# Patient Record
Sex: Female | Born: 1935 | Race: White | Hispanic: No | Smoking: Never smoker
Health system: Southern US, Community
[De-identification: ages and names within clinical notes are randomized; demographics above are authoritative.]

## PROBLEM LIST (undated history)

## (undated) DIAGNOSIS — I2510 Atherosclerotic heart disease of native coronary artery without angina pectoris: Secondary | ICD-10-CM

## (undated) DIAGNOSIS — E785 Hyperlipidemia, unspecified: Secondary | ICD-10-CM

## (undated) DIAGNOSIS — K5090 Crohn's disease, unspecified, without complications: Secondary | ICD-10-CM

## (undated) DIAGNOSIS — K529 Noninfective gastroenteritis and colitis, unspecified: Secondary | ICD-10-CM

## (undated) DIAGNOSIS — M069 Rheumatoid arthritis, unspecified: Secondary | ICD-10-CM

## (undated) DIAGNOSIS — K222 Esophageal obstruction: Secondary | ICD-10-CM

## (undated) DIAGNOSIS — D649 Anemia, unspecified: Secondary | ICD-10-CM

## (undated) DIAGNOSIS — C50911 Malignant neoplasm of unspecified site of right female breast: Secondary | ICD-10-CM

## (undated) DIAGNOSIS — I10 Essential (primary) hypertension: Secondary | ICD-10-CM

## (undated) DIAGNOSIS — K219 Gastro-esophageal reflux disease without esophagitis: Secondary | ICD-10-CM

## (undated) DIAGNOSIS — E119 Type 2 diabetes mellitus without complications: Secondary | ICD-10-CM

## (undated) DIAGNOSIS — M1990 Unspecified osteoarthritis, unspecified site: Secondary | ICD-10-CM

## (undated) HISTORY — DX: Noninfective gastroenteritis and colitis, unspecified: K52.9

## (undated) HISTORY — DX: Atherosclerotic heart disease of native coronary artery without angina pectoris: I25.10

## (undated) HISTORY — PX: VASCULAR SURGERY: SHX849

## (undated) HISTORY — DX: Malignant neoplasm of unspecified site of right female breast: C50.911

## (undated) HISTORY — DX: Crohn's disease, unspecified, without complications: K50.90

## (undated) HISTORY — PX: APPENDECTOMY: SHX54

## (undated) HISTORY — PX: SIMPLE MASTECTOMY: SHX2409

## (undated) HISTORY — DX: Hyperlipidemia, unspecified: E78.5

## (undated) HISTORY — PX: CAROTID ENDARTERECTOMY: SUR193

## (undated) HISTORY — DX: Unspecified osteoarthritis, unspecified site: M19.90

## (undated) HISTORY — DX: Type 2 diabetes mellitus without complications: E11.9

## (undated) HISTORY — PX: ABDOMINAL HYSTERECTOMY: SHX81

## (undated) HISTORY — DX: Essential (primary) hypertension: I10

## (undated) HISTORY — DX: Gastro-esophageal reflux disease without esophagitis: K21.9

## (undated) HISTORY — PX: TONSILLECTOMY AND ADENOIDECTOMY: SUR1326

## (undated) HISTORY — DX: Rheumatoid arthritis, unspecified: M06.9

## (undated) HISTORY — PX: BREAST EXCISIONAL BIOPSY: SUR124

## (undated) HISTORY — DX: Anemia, unspecified: D64.9

## (undated) HISTORY — DX: Esophageal obstruction: K22.2

## (undated) HISTORY — PX: TOTAL KNEE ARTHROPLASTY: SHX125

---

## 1999-10-10 ENCOUNTER — Encounter: Admission: RE | Admit: 1999-10-10 | Discharge: 1999-10-10 | Payer: Self-pay | Admitting: General Surgery

## 1999-10-10 ENCOUNTER — Encounter: Payer: Self-pay | Admitting: General Surgery

## 2000-10-12 ENCOUNTER — Encounter: Admission: RE | Admit: 2000-10-12 | Discharge: 2000-10-12 | Payer: Self-pay | Admitting: General Surgery

## 2000-10-12 ENCOUNTER — Encounter: Payer: Self-pay | Admitting: General Surgery

## 2001-10-25 ENCOUNTER — Encounter: Payer: Self-pay | Admitting: General Surgery

## 2001-10-25 ENCOUNTER — Encounter: Admission: RE | Admit: 2001-10-25 | Discharge: 2001-10-25 | Payer: Self-pay | Admitting: General Surgery

## 2002-11-02 ENCOUNTER — Encounter: Admission: RE | Admit: 2002-11-02 | Discharge: 2002-11-02 | Payer: Self-pay | Admitting: General Surgery

## 2002-11-02 ENCOUNTER — Encounter: Payer: Self-pay | Admitting: General Surgery

## 2003-11-08 ENCOUNTER — Encounter: Admission: RE | Admit: 2003-11-08 | Discharge: 2003-11-08 | Payer: Self-pay | Admitting: General Surgery

## 2004-09-05 ENCOUNTER — Ambulatory Visit: Payer: Self-pay

## 2004-10-06 ENCOUNTER — Ambulatory Visit: Payer: Self-pay

## 2004-11-12 ENCOUNTER — Encounter: Admission: RE | Admit: 2004-11-12 | Discharge: 2004-11-12 | Payer: Self-pay | Admitting: General Surgery

## 2005-01-23 ENCOUNTER — Ambulatory Visit: Payer: Self-pay

## 2005-11-02 ENCOUNTER — Ambulatory Visit: Payer: Self-pay | Admitting: Internal Medicine

## 2005-11-19 ENCOUNTER — Encounter: Admission: RE | Admit: 2005-11-19 | Discharge: 2005-11-19 | Payer: Self-pay | Admitting: General Surgery

## 2005-11-20 ENCOUNTER — Ambulatory Visit: Payer: Self-pay

## 2006-07-28 ENCOUNTER — Ambulatory Visit: Payer: Self-pay | Admitting: Internal Medicine

## 2006-07-29 ENCOUNTER — Ambulatory Visit: Payer: Self-pay | Admitting: Internal Medicine

## 2006-10-21 ENCOUNTER — Ambulatory Visit: Payer: Self-pay

## 2006-12-15 ENCOUNTER — Encounter: Admission: RE | Admit: 2006-12-15 | Discharge: 2006-12-15 | Payer: Self-pay | Admitting: General Surgery

## 2007-04-13 ENCOUNTER — Ambulatory Visit: Payer: Self-pay

## 2007-05-02 ENCOUNTER — Ambulatory Visit: Payer: Self-pay

## 2007-10-11 ENCOUNTER — Ambulatory Visit: Payer: Self-pay

## 2007-12-28 ENCOUNTER — Encounter: Payer: Self-pay | Admitting: Specialist

## 2008-01-27 ENCOUNTER — Encounter: Payer: Self-pay | Admitting: Specialist

## 2008-02-14 ENCOUNTER — Emergency Department (HOSPITAL_COMMUNITY): Admission: EM | Admit: 2008-02-14 | Discharge: 2008-02-14 | Payer: Self-pay | Admitting: Emergency Medicine

## 2008-02-27 ENCOUNTER — Encounter: Payer: Self-pay | Admitting: Specialist

## 2008-02-28 ENCOUNTER — Ambulatory Visit: Payer: Self-pay

## 2008-02-29 ENCOUNTER — Ambulatory Visit: Payer: Self-pay

## 2008-03-28 ENCOUNTER — Encounter: Payer: Self-pay | Admitting: Specialist

## 2008-04-28 ENCOUNTER — Encounter: Payer: Self-pay | Admitting: Specialist

## 2008-05-29 ENCOUNTER — Encounter: Payer: Self-pay | Admitting: Specialist

## 2008-09-28 ENCOUNTER — Ambulatory Visit: Payer: Self-pay | Admitting: Oncology

## 2008-10-05 ENCOUNTER — Ambulatory Visit: Payer: Self-pay | Admitting: Unknown Physician Specialty

## 2008-10-09 ENCOUNTER — Ambulatory Visit: Payer: Self-pay | Admitting: Oncology

## 2008-10-29 ENCOUNTER — Ambulatory Visit: Payer: Self-pay | Admitting: Oncology

## 2008-11-02 ENCOUNTER — Ambulatory Visit: Payer: Self-pay | Admitting: Unknown Physician Specialty

## 2008-11-26 ENCOUNTER — Ambulatory Visit: Payer: Self-pay | Admitting: Oncology

## 2008-11-30 ENCOUNTER — Ambulatory Visit: Payer: Self-pay | Admitting: Oncology

## 2008-12-27 ENCOUNTER — Ambulatory Visit: Payer: Self-pay | Admitting: Oncology

## 2009-02-26 ENCOUNTER — Ambulatory Visit: Payer: Self-pay | Admitting: Oncology

## 2009-03-15 ENCOUNTER — Ambulatory Visit: Payer: Self-pay | Admitting: Oncology

## 2009-03-28 ENCOUNTER — Ambulatory Visit: Payer: Self-pay | Admitting: Oncology

## 2009-05-28 ENCOUNTER — Ambulatory Visit: Payer: Self-pay

## 2009-06-12 ENCOUNTER — Ambulatory Visit: Payer: Self-pay

## 2009-06-28 ENCOUNTER — Ambulatory Visit: Payer: Self-pay | Admitting: Oncology

## 2009-07-05 ENCOUNTER — Ambulatory Visit: Payer: Self-pay | Admitting: Oncology

## 2009-07-29 ENCOUNTER — Ambulatory Visit: Payer: Self-pay | Admitting: Oncology

## 2010-03-25 ENCOUNTER — Ambulatory Visit: Payer: Self-pay | Admitting: Family Medicine

## 2010-05-20 ENCOUNTER — Emergency Department: Payer: Self-pay | Admitting: Internal Medicine

## 2010-06-18 ENCOUNTER — Ambulatory Visit: Payer: Self-pay

## 2010-08-29 ENCOUNTER — Ambulatory Visit: Payer: Self-pay | Admitting: Oncology

## 2010-09-28 ENCOUNTER — Ambulatory Visit: Payer: Self-pay | Admitting: Oncology

## 2010-09-28 HISTORY — PX: BREAST BIOPSY: SHX20

## 2010-10-29 ENCOUNTER — Ambulatory Visit: Payer: Self-pay | Admitting: Oncology

## 2011-07-27 ENCOUNTER — Ambulatory Visit: Payer: Self-pay

## 2011-07-29 ENCOUNTER — Ambulatory Visit: Payer: Self-pay

## 2011-09-07 ENCOUNTER — Ambulatory Visit: Payer: Self-pay | Admitting: Surgery

## 2011-09-29 HISTORY — PX: MASTECTOMY PARTIAL / LUMPECTOMY W/ AXILLARY LYMPHADENECTOMY: SUR852

## 2012-03-10 ENCOUNTER — Ambulatory Visit: Payer: Self-pay | Admitting: General Practice

## 2012-04-20 ENCOUNTER — Ambulatory Visit: Payer: Self-pay | Admitting: Oncology

## 2012-04-20 LAB — IRON AND TIBC
Iron Bind.Cap.(Total): 340 ug/dL (ref 250–450)
Unbound Iron-Bind.Cap.: 227 ug/dL

## 2012-04-20 LAB — FERRITIN: Ferritin (ARMC): 53 ng/mL (ref 8–388)

## 2012-04-28 ENCOUNTER — Ambulatory Visit: Payer: Self-pay | Admitting: Oncology

## 2012-05-25 LAB — CBC CANCER CENTER
Basophil %: 1.4 %
Eosinophil #: 0.3 x10 3/mm (ref 0.0–0.7)
HCT: 31.1 % — ABNORMAL LOW (ref 35.0–47.0)
Lymphocyte #: 0.8 x10 3/mm — ABNORMAL LOW (ref 1.0–3.6)
MCH: 29.1 pg (ref 26.0–34.0)
MCV: 90 fL (ref 80–100)
Monocyte #: 0.2 x10 3/mm (ref 0.2–0.9)
Monocyte %: 4.8 %
Neutrophil #: 2.2 x10 3/mm (ref 1.4–6.5)
Platelet: 172 x10 3/mm (ref 150–440)
RBC: 3.45 10*6/uL — ABNORMAL LOW (ref 3.80–5.20)
RDW: 29.4 % — ABNORMAL HIGH (ref 11.5–14.5)

## 2012-05-25 LAB — SEDIMENTATION RATE: Erythrocyte Sed Rate: 32 mm/hr — ABNORMAL HIGH (ref 0–30)

## 2012-05-25 LAB — FERRITIN: Ferritin (ARMC): 234 ng/mL (ref 8–388)

## 2012-05-29 ENCOUNTER — Ambulatory Visit: Payer: Self-pay | Admitting: Oncology

## 2012-09-07 ENCOUNTER — Ambulatory Visit: Payer: Self-pay | Admitting: Oncology

## 2012-09-28 ENCOUNTER — Ambulatory Visit: Payer: Self-pay | Admitting: Oncology

## 2012-10-07 ENCOUNTER — Emergency Department: Payer: Self-pay | Admitting: Internal Medicine

## 2012-10-07 LAB — CBC
HGB: 12.7 g/dL (ref 12.0–16.0)
MCH: 28.7 pg (ref 26.0–34.0)
MCHC: 32.1 g/dL (ref 32.0–36.0)
Platelet: 271 10*3/uL (ref 150–440)
RDW: 15.7 % — ABNORMAL HIGH (ref 11.5–14.5)

## 2012-10-07 LAB — COMPREHENSIVE METABOLIC PANEL
Albumin: 3.6 g/dL (ref 3.4–5.0)
Alkaline Phosphatase: 96 U/L (ref 50–136)
Anion Gap: 5 — ABNORMAL LOW (ref 7–16)
BUN: 12 mg/dL (ref 7–18)
Bilirubin,Total: 0.4 mg/dL (ref 0.2–1.0)
Calcium, Total: 9.6 mg/dL (ref 8.5–10.1)
Co2: 29 mmol/L (ref 21–32)
EGFR (African American): >60
EGFR (Non-African Amer.): 57 — ABNORMAL LOW
Glucose: 110 mg/dL — ABNORMAL HIGH (ref 65–99)
Potassium: 3.7 mmol/L (ref 3.5–5.1)
SGOT(AST): 24 U/L (ref 15–37)
SGPT (ALT): 22 U/L (ref 12–78)
Sodium: 141 mmol/L (ref 136–145)
Total Protein: 7.6 g/dL (ref 6.4–8.2)

## 2012-10-07 LAB — SEDIMENTATION RATE: Erythrocyte Sed Rate: 44 mm/hr — ABNORMAL HIGH (ref 0–30)

## 2012-11-01 ENCOUNTER — Ambulatory Visit: Payer: Self-pay | Admitting: Oncology

## 2012-12-07 ENCOUNTER — Ambulatory Visit: Payer: Self-pay | Admitting: Oncology

## 2012-12-07 LAB — CBC CANCER CENTER
Basophil #: 0.1 x10 3/mm (ref 0.0–0.1)
HCT: 33.3 % — ABNORMAL LOW (ref 35.0–47.0)
MCV: 90 fL (ref 80–100)
Monocyte #: 0.8 x10 3/mm (ref 0.2–0.9)
Neutrophil %: 71.7 %
RDW: 16.5 % — ABNORMAL HIGH (ref 11.5–14.5)
WBC: 8.1 x10 3/mm (ref 3.6–11.0)

## 2012-12-27 ENCOUNTER — Ambulatory Visit: Payer: Self-pay | Admitting: Oncology

## 2013-04-12 ENCOUNTER — Ambulatory Visit: Payer: Self-pay | Admitting: Oncology

## 2013-04-12 LAB — CBC CANCER CENTER
Basophil %: 1.1 %
Eosinophil #: 0.3 x10 3/mm (ref 0.0–0.7)
Lymphocyte #: 1.8 x10 3/mm (ref 1.0–3.6)
MCHC: 31.8 g/dL — ABNORMAL LOW (ref 32.0–36.0)
MCV: 80 fL (ref 80–100)
Monocyte #: 1 x10 3/mm — ABNORMAL HIGH (ref 0.2–0.9)
Neutrophil %: 51.5 %
Platelet: 206 x10 3/mm (ref 150–440)
RDW: 16 % — ABNORMAL HIGH (ref 11.5–14.5)

## 2013-04-13 LAB — CANCER ANTIGEN 27.29: CA 27.29: 36.3 U/mL (ref 0.0–38.6)

## 2013-04-28 ENCOUNTER — Ambulatory Visit: Payer: Self-pay | Admitting: Oncology

## 2013-08-14 ENCOUNTER — Ambulatory Visit: Payer: Self-pay | Admitting: Oncology

## 2013-08-14 LAB — IRON AND TIBC
Iron Bind.Cap.(Total): 361 ug/dL (ref 250–450)
Iron: 56 ug/dL (ref 50–170)

## 2013-08-14 LAB — CBC CANCER CENTER
Basophil #: 0.1 x10 3/mm (ref 0.0–0.1)
Eosinophil #: 0.2 x10 3/mm (ref 0.0–0.7)
Eosinophil %: 3.6 %
Lymphocyte #: 1.5 x10 3/mm (ref 1.0–3.6)
MCV: 91 fL (ref 80–100)
Monocyte %: 11.9 %
Neutrophil #: 3.9 x10 3/mm (ref 1.4–6.5)
Neutrophil %: 60.1 %
Platelet: 209 x10 3/mm (ref 150–440)
WBC: 6.5 x10 3/mm (ref 3.6–11.0)

## 2013-08-15 LAB — CANCER ANTIGEN 27.29: CA 27.29: 41.2 U/mL — ABNORMAL HIGH (ref 0.0–38.6)

## 2013-08-28 ENCOUNTER — Ambulatory Visit: Payer: Self-pay | Admitting: Oncology

## 2013-10-19 ENCOUNTER — Ambulatory Visit: Payer: Self-pay | Admitting: Oncology

## 2013-10-20 LAB — CBC CANCER CENTER
Basophil #: 0.1 x10 3/mm (ref 0.0–0.1)
Basophil %: 1 %
Eosinophil #: 0.2 x10 3/mm (ref 0.0–0.7)
Eosinophil %: 3.5 %
HCT: 30.2 % — ABNORMAL LOW (ref 35.0–47.0)
HGB: 9.3 g/dL — AB (ref 12.0–16.0)
Lymphocyte #: 1.6 x10 3/mm (ref 1.0–3.6)
Lymphocyte %: 24.4 %
MCH: 25.6 pg — AB (ref 26.0–34.0)
MCHC: 30.7 g/dL — AB (ref 32.0–36.0)
MCV: 83 fL (ref 80–100)
MONO ABS: 0.9 x10 3/mm (ref 0.2–0.9)
Monocyte %: 13.6 %
Neutrophil #: 3.9 x10 3/mm (ref 1.4–6.5)
Neutrophil %: 57.5 %
PLATELETS: 217 x10 3/mm (ref 150–440)
RBC: 3.63 10*6/uL — AB (ref 3.80–5.20)
RDW: 16.6 % — ABNORMAL HIGH (ref 11.5–14.5)
WBC: 6.7 x10 3/mm (ref 3.6–11.0)

## 2013-10-20 LAB — IRON AND TIBC
IRON SATURATION: 8 %
IRON: 32 ug/dL — AB (ref 50–170)
Iron Bind.Cap.(Total): 384 ug/dL (ref 250–450)
Unbound Iron-Bind.Cap.: 352 ug/dL

## 2013-10-20 LAB — FERRITIN: Ferritin (ARMC): 13 ng/mL (ref 8–388)

## 2013-10-21 LAB — CANCER ANTIGEN 27.29: CA 27.29: 25.2 U/mL (ref 0.0–38.6)

## 2013-10-29 ENCOUNTER — Ambulatory Visit: Payer: Self-pay | Admitting: Oncology

## 2013-11-22 ENCOUNTER — Ambulatory Visit: Payer: Self-pay | Admitting: Oncology

## 2014-01-18 ENCOUNTER — Ambulatory Visit: Payer: Self-pay | Admitting: Oncology

## 2014-01-26 ENCOUNTER — Ambulatory Visit: Payer: Self-pay | Admitting: Oncology

## 2014-01-29 LAB — IRON AND TIBC
IRON BIND. CAP.(TOTAL): 288 ug/dL (ref 250–450)
IRON SATURATION: 16 %
IRON: 46 ug/dL — AB (ref 50–170)
Unbound Iron-Bind.Cap.: 242 ug/dL

## 2014-01-29 LAB — BASIC METABOLIC PANEL
Anion Gap: 9 (ref 7–16)
BUN: 19 mg/dL — ABNORMAL HIGH (ref 7–18)
CHLORIDE: 102 mmol/L (ref 98–107)
Calcium, Total: 8.9 mg/dL (ref 8.5–10.1)
Co2: 29 mmol/L (ref 21–32)
Creatinine: 1.47 mg/dL — ABNORMAL HIGH (ref 0.60–1.30)
EGFR (Non-African Amer.): 34 — ABNORMAL LOW
GFR CALC AF AMER: 39 — AB
Glucose: 267 mg/dL — ABNORMAL HIGH (ref 65–99)
OSMOLALITY: 291 (ref 275–301)
POTASSIUM: 4.1 mmol/L (ref 3.5–5.1)
Sodium: 140 mmol/L (ref 136–145)

## 2014-01-29 LAB — CBC CANCER CENTER
BASOS ABS: 0.1 x10 3/mm (ref 0.0–0.1)
Basophil %: 0.8 %
EOS ABS: 0.2 x10 3/mm (ref 0.0–0.7)
EOS PCT: 3.1 %
HCT: 29 % — ABNORMAL LOW (ref 35.0–47.0)
HGB: 8.8 g/dL — ABNORMAL LOW (ref 12.0–16.0)
Lymphocyte #: 1 x10 3/mm (ref 1.0–3.6)
Lymphocyte %: 15.7 %
MCH: 23.2 pg — ABNORMAL LOW (ref 26.0–34.0)
MCHC: 30.5 g/dL — ABNORMAL LOW (ref 32.0–36.0)
MCV: 76 fL — AB (ref 80–100)
MONOS PCT: 11.2 %
Monocyte #: 0.7 x10 3/mm (ref 0.2–0.9)
NEUTROS ABS: 4.4 x10 3/mm (ref 1.4–6.5)
NEUTROS PCT: 69.2 %
PLATELETS: 175 x10 3/mm (ref 150–440)
RBC: 3.81 10*6/uL (ref 3.80–5.20)
RDW: 26.7 % — AB (ref 11.5–14.5)
WBC: 6.3 x10 3/mm (ref 3.6–11.0)

## 2014-01-29 LAB — FERRITIN: FERRITIN (ARMC): 353 ng/mL (ref 8–388)

## 2014-02-26 ENCOUNTER — Ambulatory Visit: Payer: Self-pay | Admitting: Oncology

## 2014-03-22 LAB — CBC CANCER CENTER
BASOS ABS: 0 x10 3/mm (ref 0.0–0.1)
Basophil %: 0.5 %
EOS ABS: 0.1 x10 3/mm (ref 0.0–0.7)
EOS PCT: 1.3 %
HCT: 34.1 % — AB (ref 35.0–47.0)
HGB: 10.4 g/dL — ABNORMAL LOW (ref 12.0–16.0)
Lymphocyte #: 1.5 x10 3/mm (ref 1.0–3.6)
Lymphocyte %: 15.2 %
MCH: 24.3 pg — ABNORMAL LOW (ref 26.0–34.0)
MCHC: 30.6 g/dL — AB (ref 32.0–36.0)
MCV: 80 fL (ref 80–100)
MONO ABS: 0.9 x10 3/mm (ref 0.2–0.9)
Monocyte %: 9.3 %
Neutrophil #: 7.2 x10 3/mm — ABNORMAL HIGH (ref 1.4–6.5)
Neutrophil %: 73.7 %
PLATELETS: 253 x10 3/mm (ref 150–440)
RBC: 4.29 10*6/uL (ref 3.80–5.20)
RDW: 25.1 % — ABNORMAL HIGH (ref 11.5–14.5)
WBC: 9.7 x10 3/mm (ref 3.6–11.0)

## 2014-03-22 LAB — IRON AND TIBC
IRON BIND. CAP.(TOTAL): 319 ug/dL (ref 250–450)
IRON: 29 ug/dL — AB (ref 50–170)
Iron Saturation: 9 %
UNBOUND IRON-BIND. CAP.: 290 ug/dL

## 2014-03-22 LAB — FERRITIN: Ferritin (ARMC): 19 ng/mL (ref 8–388)

## 2014-03-28 ENCOUNTER — Ambulatory Visit: Payer: Self-pay | Admitting: Oncology

## 2014-04-28 ENCOUNTER — Ambulatory Visit: Payer: Self-pay | Admitting: Oncology

## 2014-08-06 ENCOUNTER — Ambulatory Visit: Payer: Self-pay | Admitting: Oncology

## 2014-08-06 LAB — CBC CANCER CENTER
BASOS PCT: 0.5 %
Basophil #: 0 x10 3/mm (ref 0.0–0.1)
EOS ABS: 0.1 x10 3/mm (ref 0.0–0.7)
EOS PCT: 1.5 %
HCT: 37.7 % (ref 35.0–47.0)
HGB: 12 g/dL (ref 12.0–16.0)
LYMPHS ABS: 1.6 x10 3/mm (ref 1.0–3.6)
Lymphocyte %: 18 %
MCH: 28.6 pg (ref 26.0–34.0)
MCHC: 31.9 g/dL — AB (ref 32.0–36.0)
MCV: 90 fL (ref 80–100)
Monocyte #: 0.8 x10 3/mm (ref 0.2–0.9)
Monocyte %: 9.1 %
NEUTROS PCT: 70.9 %
Neutrophil #: 6.4 x10 3/mm (ref 1.4–6.5)
Platelet: 232 x10 3/mm (ref 150–440)
RBC: 4.2 10*6/uL (ref 3.80–5.20)
RDW: 15.3 % — ABNORMAL HIGH (ref 11.5–14.5)
WBC: 9 x10 3/mm (ref 3.6–11.0)

## 2014-08-06 LAB — IRON AND TIBC
IRON BIND. CAP.(TOTAL): 287 ug/dL (ref 250–450)
Iron Saturation: 14 %
Iron: 41 ug/dL — ABNORMAL LOW (ref 50–170)
Unbound Iron-Bind.Cap.: 246 ug/dL

## 2014-08-06 LAB — FERRITIN: Ferritin (ARMC): 34 ng/mL (ref 8–388)

## 2014-08-28 ENCOUNTER — Ambulatory Visit: Payer: Self-pay | Admitting: Oncology

## 2014-10-24 ENCOUNTER — Ambulatory Visit: Payer: Self-pay | Admitting: Oncology

## 2014-10-24 LAB — CBC CANCER CENTER
Basophil #: 0.1 x10 3/mm (ref 0.0–0.1)
Basophil %: 0.8 %
EOS ABS: 0.2 x10 3/mm (ref 0.0–0.7)
Eosinophil %: 2.7 %
HCT: 34.6 % — ABNORMAL LOW (ref 35.0–47.0)
HGB: 11 g/dL — ABNORMAL LOW (ref 12.0–16.0)
LYMPHS ABS: 1.6 x10 3/mm (ref 1.0–3.6)
LYMPHS PCT: 22.5 %
MCH: 29 pg (ref 26.0–34.0)
MCHC: 31.9 g/dL — AB (ref 32.0–36.0)
MCV: 91 fL (ref 80–100)
MONO ABS: 0.6 x10 3/mm (ref 0.2–0.9)
Monocyte %: 9.3 %
NEUTROS PCT: 64.7 %
Neutrophil #: 4.5 x10 3/mm (ref 1.4–6.5)
Platelet: 218 x10 3/mm (ref 150–440)
RBC: 3.81 10*6/uL (ref 3.80–5.20)
RDW: 14.8 % — AB (ref 11.5–14.5)
WBC: 6.9 x10 3/mm (ref 3.6–11.0)

## 2014-10-24 LAB — IRON AND TIBC
Iron Bind.Cap.(Total): 287 ug/dL (ref 250–450)
Iron Saturation: 17 %
Iron: 49 ug/dL — ABNORMAL LOW (ref 50–170)
Unbound Iron-Bind.Cap.: 238 ug/dL

## 2014-10-24 LAB — FERRITIN: Ferritin (ARMC): 39 ng/mL (ref 8–388)

## 2014-10-29 ENCOUNTER — Ambulatory Visit: Payer: Self-pay | Admitting: Oncology

## 2014-11-01 ENCOUNTER — Encounter: Payer: Self-pay | Admitting: Internal Medicine

## 2014-11-27 ENCOUNTER — Encounter
Admit: 2014-11-27 | Disposition: A | Discharge status: Home / Self Care | Payer: Self-pay | Attending: Internal Medicine | Admitting: Internal Medicine

## 2014-12-28 ENCOUNTER — Encounter
Admit: 2014-12-28 | Disposition: A | Discharge status: Home / Self Care | Payer: Self-pay | Attending: Internal Medicine | Admitting: Internal Medicine

## 2015-01-09 ENCOUNTER — Ambulatory Visit
Admit: 2015-01-09 | Disposition: A | Discharge status: Home / Self Care | Payer: Self-pay | Attending: Oncology | Admitting: Oncology

## 2015-01-09 LAB — COMPREHENSIVE METABOLIC PANEL
ALBUMIN: 3.7 g/dL
ALK PHOS: 68 U/L
ANION GAP: 11 (ref 7–16)
AST: 24 U/L
BUN: 26 mg/dL — ABNORMAL HIGH
Bilirubin,Total: 0.3 mg/dL
CALCIUM: 8.9 mg/dL
CHLORIDE: 99 mmol/L — AB
CO2: 26 mmol/L
Creatinine: 1.3 mg/dL — ABNORMAL HIGH
EGFR (Non-African Amer.): 39 — ABNORMAL LOW
GFR CALC AF AMER: 46 — AB
GLUCOSE: 192 mg/dL — AB
Potassium: 4.1 mmol/L
SGPT (ALT): 21 U/L
SODIUM: 136 mmol/L
Total Protein: 6.6 g/dL

## 2015-01-09 LAB — CBC CANCER CENTER
Basophil #: 0 x10 3/mm (ref 0.0–0.1)
Basophil %: 0.5 %
EOS ABS: 0.1 x10 3/mm (ref 0.0–0.7)
EOS PCT: 1.8 %
HCT: 28.5 % — ABNORMAL LOW (ref 35.0–47.0)
HGB: 9.2 g/dL — ABNORMAL LOW (ref 12.0–16.0)
LYMPHS ABS: 1.2 x10 3/mm (ref 1.0–3.6)
Lymphocyte %: 15.5 %
MCH: 27.3 pg (ref 26.0–34.0)
MCHC: 32.3 g/dL (ref 32.0–36.0)
MCV: 85 fL (ref 80–100)
MONO ABS: 0.7 x10 3/mm (ref 0.2–0.9)
Monocyte %: 9.7 %
NEUTROS PCT: 72.5 %
Neutrophil #: 5.4 x10 3/mm (ref 1.4–6.5)
PLATELETS: 235 x10 3/mm (ref 150–440)
RBC: 3.37 10*6/uL — AB (ref 3.80–5.20)
RDW: 15.2 % — ABNORMAL HIGH (ref 11.5–14.5)
WBC: 7.5 x10 3/mm (ref 3.6–11.0)

## 2015-01-09 LAB — IRON AND TIBC
IRON SATURATION: 8
IRON: 26 ug/dL — AB
Iron Bind.Cap.(Total): 325 (ref 250–450)
UNBOUND IRON-BIND. CAP.: 298.7

## 2015-01-09 LAB — HEMOGLOBIN A1C: HEMOGLOBIN A1C: 6.8 % — AB

## 2015-01-09 LAB — FERRITIN: FERRITIN (ARMC): 33 ng/mL

## 2015-01-17 ENCOUNTER — Ambulatory Visit: Admit: 2015-01-17 | Disposition: A | Discharge status: Home / Self Care | Payer: Self-pay

## 2015-04-03 ENCOUNTER — Inpatient Hospital Stay: Payer: Medicare Other | Attending: Oncology

## 2015-04-03 DIAGNOSIS — Z803 Family history of malignant neoplasm of breast: Secondary | ICD-10-CM | POA: Diagnosis not present

## 2015-04-03 DIAGNOSIS — E780 Pure hypercholesterolemia: Secondary | ICD-10-CM | POA: Insufficient documentation

## 2015-04-03 DIAGNOSIS — Z90710 Acquired absence of both cervix and uterus: Secondary | ICD-10-CM | POA: Insufficient documentation

## 2015-04-03 DIAGNOSIS — R531 Weakness: Secondary | ICD-10-CM | POA: Insufficient documentation

## 2015-04-03 DIAGNOSIS — I10 Essential (primary) hypertension: Secondary | ICD-10-CM | POA: Diagnosis not present

## 2015-04-03 DIAGNOSIS — M2550 Pain in unspecified joint: Secondary | ICD-10-CM | POA: Diagnosis not present

## 2015-04-03 DIAGNOSIS — Q2733 Arteriovenous malformation of digestive system vessel: Secondary | ICD-10-CM | POA: Diagnosis not present

## 2015-04-03 DIAGNOSIS — R5383 Other fatigue: Secondary | ICD-10-CM | POA: Diagnosis not present

## 2015-04-03 DIAGNOSIS — Z9011 Acquired absence of right breast and nipple: Secondary | ICD-10-CM | POA: Diagnosis not present

## 2015-04-03 DIAGNOSIS — Z853 Personal history of malignant neoplasm of breast: Secondary | ICD-10-CM | POA: Diagnosis not present

## 2015-04-03 DIAGNOSIS — Z79899 Other long term (current) drug therapy: Secondary | ICD-10-CM | POA: Insufficient documentation

## 2015-04-03 DIAGNOSIS — K5090 Crohn's disease, unspecified, without complications: Secondary | ICD-10-CM | POA: Diagnosis not present

## 2015-04-03 DIAGNOSIS — Z7982 Long term (current) use of aspirin: Secondary | ICD-10-CM | POA: Insufficient documentation

## 2015-04-03 DIAGNOSIS — M069 Rheumatoid arthritis, unspecified: Secondary | ICD-10-CM | POA: Insufficient documentation

## 2015-04-03 DIAGNOSIS — E119 Type 2 diabetes mellitus without complications: Secondary | ICD-10-CM | POA: Diagnosis not present

## 2015-04-03 DIAGNOSIS — D509 Iron deficiency anemia, unspecified: Secondary | ICD-10-CM

## 2015-04-03 DIAGNOSIS — D649 Anemia, unspecified: Secondary | ICD-10-CM | POA: Diagnosis not present

## 2015-04-03 LAB — CBC WITH DIFFERENTIAL/PLATELET
BASOS ABS: 0.1 10*3/uL (ref 0–0.1)
Basophils Relative: 1 %
EOS PCT: 2 %
Eosinophils Absolute: 0.2 10*3/uL (ref 0–0.7)
HCT: 30.5 % — ABNORMAL LOW (ref 35.0–47.0)
Hemoglobin: 9.9 g/dL — ABNORMAL LOW (ref 12.0–16.0)
Lymphocytes Relative: 21 %
Lymphs Abs: 1.6 10*3/uL (ref 1.0–3.6)
MCH: 27.8 pg (ref 26.0–34.0)
MCHC: 32.5 g/dL (ref 32.0–36.0)
MCV: 85.6 fL (ref 80.0–100.0)
MONO ABS: 0.6 10*3/uL (ref 0.2–0.9)
MONOS PCT: 8 %
NEUTROS ABS: 5 10*3/uL (ref 1.4–6.5)
NEUTROS PCT: 68 %
Platelets: 230 10*3/uL (ref 150–440)
RBC: 3.56 MIL/uL — ABNORMAL LOW (ref 3.80–5.20)
RDW: 17 % — AB (ref 11.5–14.5)
WBC: 7.5 10*3/uL (ref 3.6–11.0)

## 2015-04-03 LAB — IRON AND TIBC
IRON: 27 ug/dL — AB (ref 28–170)
Saturation Ratios: 9 % — ABNORMAL LOW (ref 10.4–31.8)
TIBC: 288 ug/dL (ref 250–450)
UIBC: 261 ug/dL

## 2015-04-03 LAB — FERRITIN: FERRITIN: 23 ng/mL (ref 11–307)

## 2015-04-05 ENCOUNTER — Inpatient Hospital Stay: Payer: Medicare Other

## 2015-04-05 ENCOUNTER — Inpatient Hospital Stay (HOSPITAL_BASED_OUTPATIENT_CLINIC_OR_DEPARTMENT_OTHER): Payer: Medicare Other | Admitting: Oncology

## 2015-04-05 VITALS — BP 135/76 | HR 60 | Temp 97.4°F | Resp 18

## 2015-04-05 VITALS — BP 154/70 | HR 58 | Temp 97.1°F | Wt 199.3 lb

## 2015-04-05 DIAGNOSIS — R5383 Other fatigue: Secondary | ICD-10-CM

## 2015-04-05 DIAGNOSIS — R531 Weakness: Secondary | ICD-10-CM

## 2015-04-05 DIAGNOSIS — M069 Rheumatoid arthritis, unspecified: Secondary | ICD-10-CM

## 2015-04-05 DIAGNOSIS — Z7982 Long term (current) use of aspirin: Secondary | ICD-10-CM

## 2015-04-05 DIAGNOSIS — K5090 Crohn's disease, unspecified, without complications: Secondary | ICD-10-CM

## 2015-04-05 DIAGNOSIS — Z803 Family history of malignant neoplasm of breast: Secondary | ICD-10-CM

## 2015-04-05 DIAGNOSIS — D649 Anemia, unspecified: Secondary | ICD-10-CM

## 2015-04-05 DIAGNOSIS — D509 Iron deficiency anemia, unspecified: Secondary | ICD-10-CM

## 2015-04-05 DIAGNOSIS — Q2733 Arteriovenous malformation of digestive system vessel: Secondary | ICD-10-CM

## 2015-04-05 DIAGNOSIS — Z853 Personal history of malignant neoplasm of breast: Secondary | ICD-10-CM

## 2015-04-05 DIAGNOSIS — M2550 Pain in unspecified joint: Secondary | ICD-10-CM

## 2015-04-05 DIAGNOSIS — Z79899 Other long term (current) drug therapy: Secondary | ICD-10-CM

## 2015-04-05 DIAGNOSIS — E119 Type 2 diabetes mellitus without complications: Secondary | ICD-10-CM

## 2015-04-05 DIAGNOSIS — E780 Pure hypercholesterolemia: Secondary | ICD-10-CM

## 2015-04-05 DIAGNOSIS — I10 Essential (primary) hypertension: Secondary | ICD-10-CM

## 2015-04-05 DIAGNOSIS — Z90710 Acquired absence of both cervix and uterus: Secondary | ICD-10-CM

## 2015-04-05 DIAGNOSIS — Z9011 Acquired absence of right breast and nipple: Secondary | ICD-10-CM

## 2015-04-05 MED ORDER — SODIUM CHLORIDE 0.9 % IV SOLN
510.0000 mg | Freq: Once | INTRAVENOUS | Status: AC
  Administered 2015-04-05: 510 mg via INTRAVENOUS
  Filled 2015-04-05: qty 17

## 2015-04-05 MED ORDER — SODIUM CHLORIDE 0.9 % IV SOLN
Freq: Once | INTRAVENOUS | Status: AC
  Administered 2015-04-05: 11:00:00 via INTRAVENOUS
  Filled 2015-04-05: qty 1000

## 2015-04-12 ENCOUNTER — Inpatient Hospital Stay: Payer: Medicare Other

## 2015-04-12 VITALS — BP 145/71 | HR 56 | Temp 98.1°F | Resp 18

## 2015-04-12 DIAGNOSIS — Z853 Personal history of malignant neoplasm of breast: Secondary | ICD-10-CM | POA: Diagnosis not present

## 2015-04-12 DIAGNOSIS — D509 Iron deficiency anemia, unspecified: Secondary | ICD-10-CM

## 2015-04-12 MED ORDER — SODIUM CHLORIDE 0.9 % IV SOLN
510.0000 mg | Freq: Once | INTRAVENOUS | Status: AC
  Administered 2015-04-12: 510 mg via INTRAVENOUS
  Filled 2015-04-12: qty 17

## 2015-04-12 MED ORDER — SODIUM CHLORIDE 0.9 % IV SOLN
Freq: Once | INTRAVENOUS | Status: AC
  Administered 2015-04-12: 09:00:00 via INTRAVENOUS
  Filled 2015-04-12: qty 1000

## 2015-04-22 NOTE — Progress Notes (Signed)
[place]  Telephone:(336) [phone] Fax:(336) [phone]  ID: Rebecca Barajas OB: 13-Jun-1934  MR#: [id number]  PYK#:[id number]  No care team member to display  CHIEF COMPLAINT:  Chief Complaint  Patient presents with  . Follow-up    INTERVAL HISTORY: Patient returns to clinic today for further evaluation and laboratory work.  She has some increased weakness and fatigue, but otherwise feels well.  She is nearly fully recovered from her knee replacement.  She has no neurologic complaints. She denies any recent fevers. She has a fair appetite.  She denies any chest pain or shortness of breath.  She denies any nausea, vomiting, constipation, or diarrhea.  She does not have any melena or hematochezia.  She has no urinary complaints.  Patient offers no further specific complaints today.  REVIEW OF SYSTEMS:   Review of Systems  Constitutional: Positive for malaise/fatigue.  Respiratory: Negative.   Cardiovascular: Negative.   Musculoskeletal: Positive for joint pain.  Neurological: Positive for weakness.    As per HPI. Otherwise, a complete review of systems is negatve.  PAST MEDICAL HISTORY:  Rheumatoid arthritis, Crohn's, hypertension, hypercholesterolemia, diabetes, recurrent breast cancer, Gastric AVMs.  appendectomy, right lumpectomy, total hysterectomy, right mastectomy, total knee replacement  FAMILY HISTORY: 2 sisters with breast cancer, daughter with breast cancer.     ADVANCED DIRECTIVES:    HEALTH MAINTENANCE: History  Substance Use Topics  . Smoking status: Not on file  . Smokeless tobacco: Not on file  . Alcohol Use: Not on file     Colonoscopy:  PAP:  Bone density:  Lipid panel:  Allergies  Allergen Reactions  . Levofloxacin Other (See Comments) and Diarrhea    Diarrhea, nausea, rectal bleeding, mouth sores DIARRHEA WITH RECTAL BLEEDING Other reaction(s): Bleeding Diarrhea, nausea, rectal bleeding, mouth sores  . Antihistamines,  Diphenhydramine-Type Other (See Comments)    hypertension  . Amlodipine Itching    Intolerant Intolerant  . Fosinopril Itching    Intolerant  . Fosinopril Sodium-Hctz     Intolerant  . Loratadine Other (See Comments)    all antihistamines  . Methotrexate Diarrhea  . Nsaids Diarrhea    Diarrhea/abdominal cramping Diarrhea/abdominal cramping    Current Outpatient Prescriptions  Medication Sig Dispense Refill  . aspirin 81 MG tablet Take 81 mg by mouth.    Marland Kitchen atenolol (TENORMIN) 25 MG tablet TAKE ONE TABLET BY MOUTH TWICE DAILY.    Marland Kitchen Cholecalciferol (PA VITAMIN D-3) 2000 UNITS CAPS Take by mouth.    . Cholecalciferol (VITAMIN D) 2000 UNITS tablet Take 2,000 Units by mouth.    . ezetimibe-simvastatin (VYTORIN) 10-20 MG per tablet Take 1 tablet by mouth.    . folic acid (FOLVITE) 1 MG tablet TAKE (1) TABLET BY MOUTH EVERY DAY    . glipiZIDE (GLUCOTROL XL) 5 MG 24 hr tablet Take 10 mg by mouth.    Marland Kitchen glucose blood test strip Use daily as instructed    . hydrochlorothiazide (HYDRODIURIL) 12.5 MG tablet Take 12.5 mg by mouth.    . irbesartan (AVAPRO) 150 MG tablet Take 150 mg by mouth.    . Multiple Vitamins-Calcium (VIACTIV MULTI-VITAMIN) CHEW Chew by mouth.    . Omeprazole 20 MG TBEC Take by mouth.    . predniSONE (DELTASONE) 5 MG tablet Take 5 mg by mouth.     No current facility-administered medications for this visit.    OBJECTIVE: Filed Vitals:   04/05/15 0956  BP: 154/70  Pulse: 58  Temp: 97.1 F (36.2 C)  There is no height on file to calculate BMI.    ECOG FS:0 - Asymptomatic  General: Well-developed, well-nourished, no acute distress. Eyes: anicteric sclera. Lungs: Clear to auscultation bilaterally. Heart: Regular rate and rhythm. No rubs, murmurs, or gallops. Abdomen: Soft, nontender, nondistended. No organomegaly noted, normoactive bowel sounds. Musculoskeletal: No edema, cyanosis, or clubbing. Neuro: Alert, answering all questions appropriately. Cranial nerves  grossly intact. Skin: No rashes or petechiae noted. Psych: Normal affect.   LAB RESULTS:  Lab Results  Component Value Date   NA 136 01/09/2015   K 4.1 01/09/2015   CL 99* 01/09/2015   CO2 26 01/09/2015   GLUCOSE 192* 01/09/2015   BUN 26* 01/09/2015   CREATININE 1.30* 01/09/2015   CALCIUM 8.9 01/09/2015   PROT 6.6 01/09/2015   ALBUMIN 3.7 01/09/2015   AST 24 01/09/2015   ALT 21 01/09/2015   ALKPHOS 68 01/09/2015   BILITOT 0.3 01/09/2015   GFRNONAA 39* 01/09/2015   GFRAA 46* 01/09/2015    Lab Results  Component Value Date   WBC 7.5 04/03/2015   NEUTROABS 5.0 04/03/2015   HGB 9.9* 04/03/2015   HCT 30.5* 04/03/2015   MCV 85.6 04/03/2015   PLT 230 04/03/2015     STUDIES: No results found.  ASSESSMENT:  Anemia and history of breast cancer.  PLAN:    1.  Anemia: Patient's hemoglobin is slightly improved, but her iron stores are still decreased. Proceed with 510 mg IV Feraheme today. Return to clinic in 1 week for a second infusion. Patient will then return to clinic in early September with repeat laboratory work and further evaluation. Given patient's renal insufficiency, she may benefit from Procrit in the future as well.  2.  Breast cancer:  Patient has discontinued her Arimidex and continues to refuse any other hormonal therapy, including tamoxifen.  Her most recent mammogram on January 17, 2015 was reported as BI-RADS 1.  Repeat in one year. 3. Knee replacement: Continue treatment as directed by orthopedics.  Patient expressed understanding and was in agreement with this plan. She also understands that She can call clinic at any time with any questions, concerns, or complaints.     Lloyd Huger, MD   04/22/2015 4:27 PM

## 2015-05-29 ENCOUNTER — Inpatient Hospital Stay: Payer: Medicare Other | Attending: Oncology

## 2015-05-29 DIAGNOSIS — Z853 Personal history of malignant neoplasm of breast: Secondary | ICD-10-CM | POA: Diagnosis present

## 2015-05-29 DIAGNOSIS — D509 Iron deficiency anemia, unspecified: Secondary | ICD-10-CM

## 2015-05-29 LAB — CBC WITH DIFFERENTIAL/PLATELET
BASOS ABS: 0 10*3/uL (ref 0–0.1)
Basophils Relative: 1 %
EOS PCT: 2 %
Eosinophils Absolute: 0.2 10*3/uL (ref 0–0.7)
HCT: 34.7 % — ABNORMAL LOW (ref 35.0–47.0)
Hemoglobin: 11.4 g/dL — ABNORMAL LOW (ref 12.0–16.0)
LYMPHS ABS: 1.3 10*3/uL (ref 1.0–3.6)
Lymphocytes Relative: 16 %
MCH: 29.7 pg (ref 26.0–34.0)
MCHC: 33 g/dL (ref 32.0–36.0)
MCV: 89.9 fL (ref 80.0–100.0)
MONO ABS: 0.7 10*3/uL (ref 0.2–0.9)
Monocytes Relative: 9 %
Neutro Abs: 6 10*3/uL (ref 1.4–6.5)
Neutrophils Relative %: 72 %
PLATELETS: 185 10*3/uL (ref 150–440)
RBC: 3.86 MIL/uL (ref 3.80–5.20)
RDW: 17.5 % — AB (ref 11.5–14.5)
WBC: 8.3 10*3/uL (ref 3.6–11.0)

## 2015-05-29 LAB — IRON AND TIBC
IRON: 61 ug/dL (ref 28–170)
Saturation Ratios: 24 % (ref 10.4–31.8)
TIBC: 252 ug/dL (ref 250–450)
UIBC: 191 ug/dL

## 2015-05-29 LAB — FERRITIN: FERRITIN: 114 ng/mL (ref 11–307)

## 2015-05-31 ENCOUNTER — Inpatient Hospital Stay: Payer: Medicare Other | Attending: Oncology | Admitting: Oncology

## 2015-05-31 ENCOUNTER — Inpatient Hospital Stay: Payer: Medicare Other

## 2015-05-31 ENCOUNTER — Encounter: Payer: Self-pay | Admitting: Oncology

## 2015-05-31 VITALS — BP 179/74 | HR 55 | Temp 96.7°F | Resp 18 | Wt 198.9 lb

## 2015-05-31 DIAGNOSIS — Z853 Personal history of malignant neoplasm of breast: Secondary | ICD-10-CM | POA: Insufficient documentation

## 2015-05-31 DIAGNOSIS — Q2733 Arteriovenous malformation of digestive system vessel: Secondary | ICD-10-CM | POA: Diagnosis not present

## 2015-05-31 DIAGNOSIS — M069 Rheumatoid arthritis, unspecified: Secondary | ICD-10-CM | POA: Insufficient documentation

## 2015-05-31 DIAGNOSIS — K5090 Crohn's disease, unspecified, without complications: Secondary | ICD-10-CM

## 2015-05-31 DIAGNOSIS — I129 Hypertensive chronic kidney disease with stage 1 through stage 4 chronic kidney disease, or unspecified chronic kidney disease: Secondary | ICD-10-CM | POA: Diagnosis not present

## 2015-05-31 DIAGNOSIS — Z79899 Other long term (current) drug therapy: Secondary | ICD-10-CM | POA: Insufficient documentation

## 2015-05-31 DIAGNOSIS — Z803 Family history of malignant neoplasm of breast: Secondary | ICD-10-CM | POA: Diagnosis not present

## 2015-05-31 DIAGNOSIS — Z9011 Acquired absence of right breast and nipple: Secondary | ICD-10-CM | POA: Diagnosis not present

## 2015-05-31 DIAGNOSIS — M2550 Pain in unspecified joint: Secondary | ICD-10-CM | POA: Insufficient documentation

## 2015-05-31 DIAGNOSIS — E780 Pure hypercholesterolemia: Secondary | ICD-10-CM | POA: Insufficient documentation

## 2015-05-31 DIAGNOSIS — D509 Iron deficiency anemia, unspecified: Secondary | ICD-10-CM | POA: Insufficient documentation

## 2015-05-31 DIAGNOSIS — Z7982 Long term (current) use of aspirin: Secondary | ICD-10-CM | POA: Insufficient documentation

## 2015-05-31 DIAGNOSIS — Z96659 Presence of unspecified artificial knee joint: Secondary | ICD-10-CM | POA: Insufficient documentation

## 2015-05-31 DIAGNOSIS — E119 Type 2 diabetes mellitus without complications: Secondary | ICD-10-CM

## 2015-06-03 NOTE — Progress Notes (Signed)
[place]  Telephone:(336) [phone] Fax:(336) [phone]  ID: Rebecca Barajas OB: 11/12/1933  MR#: [id number]  JIR#:[id number]  No care team member to display  CHIEF COMPLAINT:  Chief Complaint  Patient presents with  . Follow-up    IDA    INTERVAL HISTORY: Patient returns to clinic today for further evaluation and laboratory work.  She currently feels well and is asymptomatic. She has no neurologic complaints. She denies any recent fevers. She has a fair appetite.  She denies any chest pain or shortness of breath.  She denies any nausea, vomiting, constipation, or diarrhea.  She does not have any melena or hematochezia.  She has no urinary complaints.  Patient offers no specific complaints today.  REVIEW OF SYSTEMS:   Review of Systems  Constitutional: Negative for malaise/fatigue.  Respiratory: Negative.   Cardiovascular: Negative.   Musculoskeletal: Positive for joint pain.  Neurological: Negative for weakness.    As per HPI. Otherwise, a complete review of systems is negatve.  PAST MEDICAL HISTORY:  Rheumatoid arthritis, Crohn's, hypertension, hypercholesterolemia, diabetes, recurrent breast cancer, Gastric AVMs.  appendectomy, right lumpectomy, total hysterectomy, right mastectomy, total knee replacement  FAMILY HISTORY: 2 sisters with breast cancer, daughter with breast cancer.     ADVANCED DIRECTIVES:    HEALTH MAINTENANCE: Social History  Substance Use Topics  . Smoking status: Never Smoker   . Smokeless tobacco: Never Used  . Alcohol Use: No     Colonoscopy:  PAP:  Bone density:  Lipid panel:  Allergies  Allergen Reactions  . Levofloxacin Other (See Comments) and Diarrhea    Diarrhea, nausea, rectal bleeding, mouth sores DIARRHEA WITH RECTAL BLEEDING Other reaction(s): Bleeding Diarrhea, nausea, rectal bleeding, mouth sores  . Antihistamines, Diphenhydramine-Type Other (See Comments)    hypertension  . Amlodipine Itching   Intolerant Intolerant  . Fosinopril Itching    Intolerant  . Fosinopril Sodium-Hctz     Intolerant  . Loratadine Other (See Comments)    all antihistamines  . Methotrexate Diarrhea  . Nsaids Diarrhea    Diarrhea/abdominal cramping Diarrhea/abdominal cramping    Current Outpatient Prescriptions  Medication Sig Dispense Refill  . aspirin 81 MG tablet Take 81 mg by mouth.    Marland Kitchen atenolol (TENORMIN) 25 MG tablet TAKE ONE TABLET BY MOUTH TWICE DAILY.    Marland Kitchen Cholecalciferol (VITAMIN D) 2000 UNITS tablet Take 2,000 Units by mouth.    . ezetimibe-simvastatin (VYTORIN) 10-20 MG per tablet Take 1 tablet by mouth.    . folic acid (FOLVITE) 1 MG tablet TAKE (1) TABLET BY MOUTH EVERY DAY    . glipiZIDE (GLUCOTROL XL) 5 MG 24 hr tablet Take 10 mg by mouth.    . hydrochlorothiazide (HYDRODIURIL) 12.5 MG tablet Take 12.5 mg by mouth.    . irbesartan (AVAPRO) 150 MG tablet Take 150 mg by mouth.    . Multiple Vitamins-Calcium (VIACTIV MULTI-VITAMIN) CHEW Chew by mouth.    . Omeprazole 20 MG TBEC Take by mouth.    . predniSONE (DELTASONE) 5 MG tablet Take 5 mg by mouth.     No current facility-administered medications for this visit.    OBJECTIVE: Filed Vitals:   05/31/15 0905  BP: 179/74  Pulse: 55  Temp: 96.7 F (35.9 C)  Resp: 18     There is no height on file to calculate BMI.    ECOG FS:0 - Asymptomatic  General: Well-developed, well-nourished, no acute distress. Eyes: anicteric sclera. Lungs: Clear to auscultation bilaterally. Heart: Regular rate and rhythm. No  rubs, murmurs, or gallops. Abdomen: Soft, nontender, nondistended. No organomegaly noted, normoactive bowel sounds. Musculoskeletal: No edema, cyanosis, or clubbing. Neuro: Alert, answering all questions appropriately. Cranial nerves grossly intact. Skin: No rashes or petechiae noted. Psych: Normal affect.   LAB RESULTS:  Lab Results  Component Value Date   NA 136 01/09/2015   K 4.1 01/09/2015   CL 99* 01/09/2015    CO2 26 01/09/2015   GLUCOSE 192* 01/09/2015   BUN 26* 01/09/2015   CREATININE 1.30* 01/09/2015   CALCIUM 8.9 01/09/2015   PROT 6.6 01/09/2015   ALBUMIN 3.7 01/09/2015   AST 24 01/09/2015   ALT 21 01/09/2015   ALKPHOS 68 01/09/2015   BILITOT 0.3 01/09/2015   GFRNONAA 39* 01/09/2015   GFRAA 46* 01/09/2015    Lab Results  Component Value Date   WBC 8.3 05/29/2015   NEUTROABS 6.0 05/29/2015   HGB 11.4* 05/29/2015   HCT 34.7* 05/29/2015   MCV 89.9 05/29/2015   PLT 185 05/29/2015     STUDIES: No results found.  ASSESSMENT:  Anemia and history of breast cancer.  PLAN:    1.  Anemia: Patient's hemoglobin and iron stores have significantly improved, therefore she does not require additional Feraheme today. Patient last received Feraheme in July 2016. Return to clinic in 3 months with repeat laboratory work and further evaluation. Given patient's renal insufficiency, she may benefit from Procrit in the future as well.  2.  Breast cancer:  Patient has discontinued her Arimidex and continues to refuse any other hormonal therapy, including tamoxifen.  Her most recent mammogram on January 17, 2015 was reported as BI-RADS 1.  Repeat in one year. 3. Knee replacement: Continue treatment as directed by orthopedics. 4. Chronic renal insufficiency: Patient's creatinine is approximately her baseline, monitor.  Patient expressed understanding and was in agreement with this plan. She also understands that She can call clinic at any time with any questions, concerns, or complaints.     Lloyd Huger, MD   06/03/2015 1:12 PM

## 2015-08-28 ENCOUNTER — Inpatient Hospital Stay: Payer: Medicare Other | Attending: Oncology

## 2015-08-28 DIAGNOSIS — D509 Iron deficiency anemia, unspecified: Secondary | ICD-10-CM | POA: Diagnosis present

## 2015-08-28 LAB — CBC WITH DIFFERENTIAL/PLATELET
BASOS ABS: 0.1 10*3/uL (ref 0–0.1)
Basophils Relative: 1 %
Eosinophils Absolute: 0.2 10*3/uL (ref 0–0.7)
Eosinophils Relative: 2 %
HEMATOCRIT: 35.9 % (ref 35.0–47.0)
Hemoglobin: 11.8 g/dL — ABNORMAL LOW (ref 12.0–16.0)
Lymphocytes Relative: 20 %
Lymphs Abs: 1.8 10*3/uL (ref 1.0–3.6)
MCH: 28.7 pg (ref 26.0–34.0)
MCHC: 32.8 g/dL (ref 32.0–36.0)
MCV: 87.3 fL (ref 80.0–100.0)
Monocytes Absolute: 0.9 10*3/uL (ref 0.2–0.9)
Monocytes Relative: 9 %
Neutro Abs: 6.2 10*3/uL (ref 1.4–6.5)
Neutrophils Relative %: 68 %
Platelets: 204 10*3/uL (ref 150–440)
RBC: 4.11 MIL/uL (ref 3.80–5.20)
RDW: 14.5 % (ref 11.5–14.5)
WBC: 9.2 10*3/uL (ref 3.6–11.0)

## 2015-08-28 LAB — IRON AND TIBC
Iron: 61 ug/dL (ref 28–170)
Saturation Ratios: 22 % (ref 10.4–31.8)
TIBC: 279 ug/dL (ref 250–450)
UIBC: 218 ug/dL

## 2015-08-28 LAB — FERRITIN: FERRITIN: 43 ng/mL (ref 11–307)

## 2015-08-30 ENCOUNTER — Inpatient Hospital Stay: Payer: Medicare Other

## 2015-08-30 ENCOUNTER — Inpatient Hospital Stay: Payer: Medicare Other | Attending: Oncology | Admitting: Oncology

## 2015-08-30 VITALS — BP 145/62 | HR 68 | Temp 97.0°F | Ht 68.0 in | Wt 197.2 lb

## 2015-08-30 DIAGNOSIS — Z8719 Personal history of other diseases of the digestive system: Secondary | ICD-10-CM | POA: Diagnosis not present

## 2015-08-30 DIAGNOSIS — Z7982 Long term (current) use of aspirin: Secondary | ICD-10-CM | POA: Insufficient documentation

## 2015-08-30 DIAGNOSIS — Z803 Family history of malignant neoplasm of breast: Secondary | ICD-10-CM | POA: Diagnosis not present

## 2015-08-30 DIAGNOSIS — E7800 Pure hypercholesterolemia, unspecified: Secondary | ICD-10-CM | POA: Diagnosis not present

## 2015-08-30 DIAGNOSIS — D649 Anemia, unspecified: Secondary | ICD-10-CM | POA: Insufficient documentation

## 2015-08-30 DIAGNOSIS — M069 Rheumatoid arthritis, unspecified: Secondary | ICD-10-CM | POA: Diagnosis not present

## 2015-08-30 DIAGNOSIS — E119 Type 2 diabetes mellitus without complications: Secondary | ICD-10-CM | POA: Insufficient documentation

## 2015-08-30 DIAGNOSIS — Z7984 Long term (current) use of oral hypoglycemic drugs: Secondary | ICD-10-CM | POA: Diagnosis not present

## 2015-08-30 DIAGNOSIS — N189 Chronic kidney disease, unspecified: Secondary | ICD-10-CM | POA: Insufficient documentation

## 2015-08-30 DIAGNOSIS — Z79899 Other long term (current) drug therapy: Secondary | ICD-10-CM | POA: Insufficient documentation

## 2015-08-30 DIAGNOSIS — D509 Iron deficiency anemia, unspecified: Secondary | ICD-10-CM

## 2015-08-30 DIAGNOSIS — Z853 Personal history of malignant neoplasm of breast: Secondary | ICD-10-CM | POA: Insufficient documentation

## 2015-08-30 DIAGNOSIS — Z7952 Long term (current) use of systemic steroids: Secondary | ICD-10-CM | POA: Diagnosis not present

## 2015-08-30 DIAGNOSIS — I129 Hypertensive chronic kidney disease with stage 1 through stage 4 chronic kidney disease, or unspecified chronic kidney disease: Secondary | ICD-10-CM | POA: Insufficient documentation

## 2015-08-30 DIAGNOSIS — K5090 Crohn's disease, unspecified, without complications: Secondary | ICD-10-CM | POA: Diagnosis not present

## 2015-09-15 NOTE — Progress Notes (Signed)
[place]  Telephone:(336) [phone] Fax:(336) [phone]  ID: Alma Downs OB: [birth year]  MR#: [id number]  [id number]  No care team member to display  CHIEF COMPLAINT:  Chief Complaint  Patient presents with  . Anemia    3 month follow up    INTERVAL HISTORY: Patient returns to clinic today for further evaluation and laboratory work.  She continues to feel well and is asymptomatic. She has no neurologic complaints. She denies any recent fevers. She has a fair appetite.  She denies any chest pain or shortness of breath.  She denies any nausea, vomiting, constipation, or diarrhea.  She does not have any melena or hematochezia.  She has no urinary complaints.  Patient offers no specific complaints today.  REVIEW OF SYSTEMS:   Review of Systems  Constitutional: Negative for malaise/fatigue.  Respiratory: Negative.   Cardiovascular: Negative.   Gastrointestinal: Negative.  Negative for blood in stool and melena.  Musculoskeletal: Positive for joint pain.  Neurological: Negative.  Negative for weakness.    As per HPI. Otherwise, a complete review of systems is negatve.  PAST MEDICAL HISTORY:  Rheumatoid arthritis, Crohn's, hypertension, hypercholesterolemia, diabetes, recurrent breast cancer, Gastric AVMs.  appendectomy, right lumpectomy, total hysterectomy, right mastectomy, total knee replacement  FAMILY HISTORY: 2 sisters with breast cancer, daughter with breast cancer.     ADVANCED DIRECTIVES:    HEALTH MAINTENANCE: Social History  Substance Use Topics  . Smoking status: Never Smoker   . Smokeless tobacco: Never Used  . Alcohol Use: No     Colonoscopy:  PAP:  Bone density:  Lipid panel:  Allergies  Allergen Reactions  . Levofloxacin Other (See Comments) and Diarrhea    Diarrhea, nausea, rectal bleeding, mouth sores DIARRHEA WITH RECTAL BLEEDING Other reaction(s): Bleeding Diarrhea, nausea, rectal bleeding, mouth sores  . Antihistamines,  Diphenhydramine-Type Other (See Comments)    hypertension  . Amlodipine Itching    Intolerant Intolerant  . Fosinopril Itching    Intolerant  . Fosinopril Sodium-Hctz     Intolerant  . Loratadine Other (See Comments)    all antihistamines  . Methotrexate Diarrhea  . Nsaids Diarrhea    Diarrhea/abdominal cramping Diarrhea/abdominal cramping    Current Outpatient Prescriptions  Medication Sig Dispense Refill  . aspirin 81 MG tablet Take 81 mg by mouth.    Marland Kitchen atenolol (TENORMIN) 25 MG tablet TAKE ONE TABLET BY MOUTH TWICE DAILY.    Marland Kitchen Cholecalciferol (VITAMIN D) 2000 UNITS tablet Take 2,000 Units by mouth.    . ezetimibe-simvastatin (VYTORIN) 10-20 MG per tablet Take 1 tablet by mouth.    . folic acid (FOLVITE) 1 MG tablet TAKE (1) TABLET BY MOUTH EVERY DAY    . glipiZIDE (GLUCOTROL XL) 10 MG 24 hr tablet Take 10 mg by mouth.    . hydrochlorothiazide (HYDRODIURIL) 12.5 MG tablet Take 12.5 mg by mouth.    . irbesartan (AVAPRO) 150 MG tablet Take 150 mg by mouth.    . Multiple Vitamins-Calcium (VIACTIV MULTI-VITAMIN) CHEW Chew by mouth.    . Omeprazole 20 MG TBEC Take by mouth.    . predniSONE (DELTASONE) 5 MG tablet Take 5 mg by mouth.     No current facility-administered medications for this visit.    OBJECTIVE: Filed Vitals:   08/30/15 0945  BP: 145/62  Pulse: 68  Temp: 97 F (36.1 C)     Body mass index is 29.99 kg/(m^2).    ECOG FS:0 - Asymptomatic  General: Well-developed, well-nourished, no acute distress. Eyes:  anicteric sclera. Lungs: Clear to auscultation bilaterally. Heart: Regular rate and rhythm. No rubs, murmurs, or gallops. Abdomen: Soft, nontender, nondistended. No organomegaly noted, normoactive bowel sounds. Musculoskeletal: No edema, cyanosis, or clubbing. Neuro: Alert, answering all questions appropriately. Cranial nerves grossly intact. Skin: No rashes or petechiae noted. Psych: Normal affect.   LAB RESULTS:  Lab Results  Component Value Date    NA 136 01/09/2015   K 4.1 01/09/2015   CL 99* 01/09/2015   CO2 26 01/09/2015   GLUCOSE 192* 01/09/2015   BUN 26* 01/09/2015   CREATININE 1.30* 01/09/2015   CALCIUM 8.9 01/09/2015   PROT 6.6 01/09/2015   ALBUMIN 3.7 01/09/2015   AST 24 01/09/2015   ALT 21 01/09/2015   ALKPHOS 68 01/09/2015   BILITOT 0.3 01/09/2015   GFRNONAA 39* 01/09/2015   GFRAA 46* 01/09/2015    Lab Results  Component Value Date   WBC 9.2 08/28/2015   NEUTROABS 6.2 08/28/2015   HGB 11.8* 08/28/2015   HCT 35.9 08/28/2015   MCV 87.3 08/28/2015   PLT 204 08/28/2015     STUDIES: No results found.  ASSESSMENT:  Anemia and history of breast cancer.  PLAN:    1.  Anemia: Patient's hemoglobin and iron stores are essentially within normal limits, therefore she does not require additional Feraheme today. Patient last received Feraheme in July 2016. Return to clinic in 3 months with repeat laboratory work and further evaluation. Given patient's renal insufficiency, she may benefit from Procrit in the future as well if her hemoglobin fell below 10.0.  2.  Breast cancer:  Patient has discontinued her Arimidex and continues to refuse any other hormonal therapy, including tamoxifen.  Her most recent mammogram on January 17, 2015 was reported as BI-RADS 1.  Repeat in one year. 3. Knee replacement: Continue treatment as directed by orthopedics. 4. Chronic renal insufficiency: Patient's creatinine is approximately her baseline, monitor.  Patient expressed understanding and was in agreement with this plan. She also understands that She can call clinic at any time with any questions, concerns, or complaints.     Lloyd Huger, MD   09/15/2015 7:05 AM

## 2015-11-26 ENCOUNTER — Other Ambulatory Visit: Payer: Self-pay | Admitting: *Deleted

## 2015-11-26 DIAGNOSIS — D509 Iron deficiency anemia, unspecified: Secondary | ICD-10-CM

## 2015-11-27 ENCOUNTER — Inpatient Hospital Stay: Payer: Medicare Other | Attending: Oncology

## 2015-11-27 DIAGNOSIS — D509 Iron deficiency anemia, unspecified: Secondary | ICD-10-CM

## 2015-11-27 DIAGNOSIS — M069 Rheumatoid arthritis, unspecified: Secondary | ICD-10-CM | POA: Diagnosis not present

## 2015-11-27 DIAGNOSIS — J069 Acute upper respiratory infection, unspecified: Secondary | ICD-10-CM | POA: Insufficient documentation

## 2015-11-27 DIAGNOSIS — R531 Weakness: Secondary | ICD-10-CM | POA: Diagnosis not present

## 2015-11-27 DIAGNOSIS — Z853 Personal history of malignant neoplasm of breast: Secondary | ICD-10-CM | POA: Diagnosis not present

## 2015-11-27 DIAGNOSIS — Z803 Family history of malignant neoplasm of breast: Secondary | ICD-10-CM | POA: Insufficient documentation

## 2015-11-27 DIAGNOSIS — K5090 Crohn's disease, unspecified, without complications: Secondary | ICD-10-CM | POA: Diagnosis not present

## 2015-11-27 DIAGNOSIS — R5383 Other fatigue: Secondary | ICD-10-CM | POA: Insufficient documentation

## 2015-11-27 DIAGNOSIS — I129 Hypertensive chronic kidney disease with stage 1 through stage 4 chronic kidney disease, or unspecified chronic kidney disease: Secondary | ICD-10-CM | POA: Diagnosis not present

## 2015-11-27 DIAGNOSIS — Q2730 Arteriovenous malformation, site unspecified: Secondary | ICD-10-CM | POA: Insufficient documentation

## 2015-11-27 DIAGNOSIS — D649 Anemia, unspecified: Secondary | ICD-10-CM | POA: Insufficient documentation

## 2015-11-27 DIAGNOSIS — Z7982 Long term (current) use of aspirin: Secondary | ICD-10-CM | POA: Diagnosis not present

## 2015-11-27 DIAGNOSIS — E7800 Pure hypercholesterolemia, unspecified: Secondary | ICD-10-CM | POA: Diagnosis not present

## 2015-11-27 DIAGNOSIS — Z79899 Other long term (current) drug therapy: Secondary | ICD-10-CM | POA: Diagnosis not present

## 2015-11-27 DIAGNOSIS — E1122 Type 2 diabetes mellitus with diabetic chronic kidney disease: Secondary | ICD-10-CM | POA: Diagnosis not present

## 2015-11-27 LAB — CBC WITH DIFFERENTIAL/PLATELET
Basophils Absolute: 0 10*3/uL (ref 0–0.1)
Basophils Relative: 1 %
Eosinophils Absolute: 0.2 10*3/uL (ref 0–0.7)
Eosinophils Relative: 3 %
HEMATOCRIT: 34.6 % — AB (ref 35.0–47.0)
HEMOGLOBIN: 11.1 g/dL — AB (ref 12.0–16.0)
LYMPHS PCT: 23 %
Lymphs Abs: 1.5 10*3/uL (ref 1.0–3.6)
MCH: 27.5 pg (ref 26.0–34.0)
MCHC: 32.2 g/dL (ref 32.0–36.0)
MCV: 85.3 fL (ref 80.0–100.0)
MONO ABS: 0.9 10*3/uL (ref 0.2–0.9)
Monocytes Relative: 14 %
NEUTROS ABS: 3.9 10*3/uL (ref 1.4–6.5)
Neutrophils Relative %: 59 %
Platelets: 207 10*3/uL (ref 150–440)
RBC: 4.06 MIL/uL (ref 3.80–5.20)
RDW: 14.3 % (ref 11.5–14.5)
WBC: 6.5 10*3/uL (ref 3.6–11.0)

## 2015-11-27 LAB — IRON AND TIBC
Iron: 23 ug/dL — ABNORMAL LOW (ref 28–170)
SATURATION RATIOS: 8 % — AB (ref 10.4–31.8)
TIBC: 301 ug/dL (ref 250–450)
UIBC: 278 ug/dL

## 2015-11-27 LAB — FERRITIN: Ferritin: 23 ng/mL (ref 11–307)

## 2015-11-29 ENCOUNTER — Ambulatory Visit
Admission: RE | Admit: 2015-11-29 | Discharge: 2015-11-29 | Disposition: A | Discharge status: Home / Self Care | Payer: Medicare Other | Source: Ambulatory Visit | Attending: Family Medicine | Admitting: Family Medicine

## 2015-11-29 ENCOUNTER — Encounter: Payer: Self-pay | Admitting: Family Medicine

## 2015-11-29 ENCOUNTER — Inpatient Hospital Stay (HOSPITAL_BASED_OUTPATIENT_CLINIC_OR_DEPARTMENT_OTHER): Payer: Medicare Other | Admitting: Family Medicine

## 2015-11-29 ENCOUNTER — Inpatient Hospital Stay: Payer: Medicare Other

## 2015-11-29 VITALS — BP 155/77 | HR 67 | Temp 97.8°F | Resp 16 | Wt 198.9 lb

## 2015-11-29 VITALS — BP 132/79 | HR 60 | Temp 96.8°F | Resp 19

## 2015-11-29 DIAGNOSIS — D649 Anemia, unspecified: Secondary | ICD-10-CM

## 2015-11-29 DIAGNOSIS — R062 Wheezing: Secondary | ICD-10-CM

## 2015-11-29 DIAGNOSIS — Z853 Personal history of malignant neoplasm of breast: Secondary | ICD-10-CM

## 2015-11-29 DIAGNOSIS — I129 Hypertensive chronic kidney disease with stage 1 through stage 4 chronic kidney disease, or unspecified chronic kidney disease: Secondary | ICD-10-CM | POA: Diagnosis not present

## 2015-11-29 DIAGNOSIS — K5090 Crohn's disease, unspecified, without complications: Secondary | ICD-10-CM

## 2015-11-29 DIAGNOSIS — Z79899 Other long term (current) drug therapy: Secondary | ICD-10-CM

## 2015-11-29 DIAGNOSIS — R531 Weakness: Secondary | ICD-10-CM

## 2015-11-29 DIAGNOSIS — E1122 Type 2 diabetes mellitus with diabetic chronic kidney disease: Secondary | ICD-10-CM | POA: Diagnosis not present

## 2015-11-29 DIAGNOSIS — D509 Iron deficiency anemia, unspecified: Secondary | ICD-10-CM

## 2015-11-29 DIAGNOSIS — R5383 Other fatigue: Secondary | ICD-10-CM

## 2015-11-29 DIAGNOSIS — M069 Rheumatoid arthritis, unspecified: Secondary | ICD-10-CM

## 2015-11-29 DIAGNOSIS — Z7982 Long term (current) use of aspirin: Secondary | ICD-10-CM

## 2015-11-29 DIAGNOSIS — E7800 Pure hypercholesterolemia, unspecified: Secondary | ICD-10-CM

## 2015-11-29 DIAGNOSIS — J069 Acute upper respiratory infection, unspecified: Secondary | ICD-10-CM

## 2015-11-29 DIAGNOSIS — Z803 Family history of malignant neoplasm of breast: Secondary | ICD-10-CM

## 2015-11-29 DIAGNOSIS — Q2730 Arteriovenous malformation, site unspecified: Secondary | ICD-10-CM

## 2015-11-29 MED ORDER — SODIUM CHLORIDE 0.9 % IV SOLN
510.0000 mg | Freq: Once | INTRAVENOUS | Status: AC
  Administered 2015-11-29: 510 mg via INTRAVENOUS
  Filled 2015-11-29: qty 17

## 2015-11-29 MED ORDER — SODIUM CHLORIDE 0.9 % IV SOLN
Freq: Once | INTRAVENOUS | Status: AC
  Administered 2015-11-29: 11:00:00 via INTRAVENOUS
  Filled 2015-11-29: qty 1000

## 2015-11-29 NOTE — Progress Notes (Signed)
[place]  Telephone:(336) [phone] Fax:(336) [phone]  ID: Alma Downs OB: 03-07-1934  MR#: [id number]  [id number]  No care team member to display  CHIEF COMPLAINT:  Chief Complaint  Patient presents with  . Anemia    INTERVAL HISTORY: Patient returns to clinic today for further evaluation and laboratory work.  She reports having some chest congestion with cough and wheezing for approximately 4 weeks. She has been seen by PCP several weeks ago. She reports persistent fatigue mainly related to upper respiratory infection.Otherwise, she has been in her usual state of health.     REVIEW OF SYSTEMS:   Review of Systems  Constitutional: Positive for malaise/fatigue. Negative for fever, chills, weight loss and diaphoresis.  HENT: Positive for congestion.        X 4 weeks  Eyes: Negative.   Respiratory: Positive for cough and wheezing. Negative for hemoptysis, sputum production and shortness of breath.   Cardiovascular: Negative.  Negative for chest pain, palpitations, orthopnea, claudication, leg swelling and PND.  Gastrointestinal: Negative.  Negative for heartburn, nausea, vomiting, abdominal pain, diarrhea, constipation, blood in stool and melena.  Genitourinary: Negative.   Skin: Negative.   Neurological: Positive for weakness. Negative for dizziness, tingling, focal weakness and seizures.  Endo/Heme/Allergies: Does not bruise/bleed easily.  Psychiatric/Behavioral: Negative for depression. The patient is not nervous/anxious and does not have insomnia.     As per HPI. Otherwise, a complete review of systems is negatve.  PAST MEDICAL HISTORY:  Rheumatoid arthritis, Crohn's, hypertension, hypercholesterolemia, diabetes, recurrent breast cancer, Gastric AVMs.  appendectomy, right lumpectomy, total hysterectomy, right mastectomy, total knee replacement  FAMILY HISTORY: 2 sisters with breast cancer, daughter with breast cancer.     ADVANCED DIRECTIVES:     HEALTH MAINTENANCE: Social History  Substance Use Topics  . Smoking status: Never Smoker   . Smokeless tobacco: Never Used  . Alcohol Use: No    Allergies  Allergen Reactions  . Levofloxacin Other (See Comments) and Diarrhea    Diarrhea, nausea, rectal bleeding, mouth sores DIARRHEA WITH RECTAL BLEEDING Other reaction(s): Bleeding Diarrhea, nausea, rectal bleeding, mouth sores  . Antihistamines, Diphenhydramine-Type Other (See Comments)    hypertension  . Amlodipine Itching    Intolerant Intolerant  . Fosinopril Itching    Intolerant  . Fosinopril Sodium-Hctz     Intolerant  . Loratadine Other (See Comments)    all antihistamines  . Methotrexate Diarrhea  . Nsaids Diarrhea    Diarrhea/abdominal cramping Diarrhea/abdominal cramping    Current Outpatient Prescriptions  Medication Sig Dispense Refill  . aspirin 81 MG tablet Take 81 mg by mouth.    Marland Kitchen atenolol (TENORMIN) 25 MG tablet TAKE ONE TABLET BY MOUTH TWICE DAILY.    Marland Kitchen Cholecalciferol (VITAMIN D) 2000 UNITS tablet Take 2,000 Units by mouth.    . ezetimibe-simvastatin (VYTORIN) 10-20 MG per tablet Take 1 tablet by mouth.    . folic acid (FOLVITE) 1 MG tablet TAKE (1) TABLET BY MOUTH EVERY DAY    . glipiZIDE (GLUCOTROL XL) 10 MG 24 hr tablet Take 10 mg by mouth.    . hydrochlorothiazide (HYDRODIURIL) 12.5 MG tablet Take 12.5 mg by mouth.    . irbesartan (AVAPRO) 150 MG tablet Take 150 mg by mouth.    . Multiple Vitamins-Calcium (VIACTIV MULTI-VITAMIN) CHEW Chew by mouth.    . Omeprazole 20 MG TBEC Take by mouth.    . predniSONE (DELTASONE) 5 MG tablet Take 5 mg by mouth.     No current facility-administered  medications for this visit.    OBJECTIVE: Filed Vitals:   11/29/15 0940  BP: 155/77  Pulse: 67  Temp: 97.8 F (36.6 C)  Resp: 16     Body mass index is 30.24 kg/(m^2).    ECOG FS:0 - Asymptomatic  General: Well-developed, well-nourished, no acute distress. Eyes: anicteric sclera. Lungs: anterior  inspiratory wheezing bilaterally. Heart: Regular rate and rhythm. No rubs, murmurs, or gallops. Abdomen: Soft, nontender, nondistended. No organomegaly noted, normoactive bowel sounds. Musculoskeletal: No edema, cyanosis, or clubbing. Neuro: Alert, answering all questions appropriately. Cranial nerves grossly intact. Skin: No rashes or petechiae noted. Psych: Normal affect.   LAB RESULTS:  Lab Results  Component Value Date   NA 136 01/09/2015   K 4.1 01/09/2015   CL 99* 01/09/2015   CO2 26 01/09/2015   GLUCOSE 192* 01/09/2015   BUN 26* 01/09/2015   CREATININE 1.30* 01/09/2015   CALCIUM 8.9 01/09/2015   PROT 6.6 01/09/2015   ALBUMIN 3.7 01/09/2015   AST 24 01/09/2015   ALT 21 01/09/2015   ALKPHOS 68 01/09/2015   BILITOT 0.3 01/09/2015   GFRNONAA 39* 01/09/2015   GFRAA 46* 01/09/2015    Lab Results  Component Value Date   WBC 6.5 11/27/2015   NEUTROABS 3.9 11/27/2015   HGB 11.1* 11/27/2015   HCT 34.6* 11/27/2015   MCV 85.3 11/27/2015   PLT 207 11/27/2015     STUDIES: No results found.  ASSESSMENT:  Anemia and history of breast cancer.  PLAN:    1.  Anemia: Patient's hemoglobin is 11.1, iron 23, ferritin 23. Will proceed with Feraheme 510mg  today and again in 1 weeks. Given patient's renal insufficiency, she may benefit from Procrit in the future as well if her hemoglobin fell below 10.0.  2.  Breast cancer:  Patient has discontinued her Arimidex and continues to refuse any other hormonal therapy, including tamoxifen.  Her most recent mammogram on January 17, 2015 was reported as BI-RADS 1.  Repeat in April 2017. 3. URI. CXR was negative for acute cardiopulmonary findings. Likely a viral bronchitis, encouraged her to try OTC Delsym for symptom relief. 4. Chronic renal insufficiency: Patient's creatinine is approximately her baseline, monitor.  Patient will continue with routine follow up in 3 months. Advised that if cough is not improved over the next 2 weeks she  should see her PCP again.  Patient expressed understanding and was in agreement with this plan. She also understands that She can call clinic at any time with any questions, concerns, or complaints.     Evlyn Kanner, NP   11/29/2015 10:08 AM

## 2015-11-29 NOTE — Progress Notes (Signed)
Patient has a cough for 4 weeks.

## 2015-12-06 ENCOUNTER — Inpatient Hospital Stay: Payer: Medicare Other

## 2015-12-06 VITALS — BP 175/83 | HR 50 | Temp 96.6°F | Resp 18

## 2015-12-06 DIAGNOSIS — D649 Anemia, unspecified: Secondary | ICD-10-CM | POA: Diagnosis not present

## 2015-12-06 DIAGNOSIS — D509 Iron deficiency anemia, unspecified: Secondary | ICD-10-CM

## 2015-12-06 MED ORDER — SODIUM CHLORIDE 0.9 % IV SOLN
Freq: Once | INTRAVENOUS | Status: AC
  Administered 2015-12-06: 10:00:00 via INTRAVENOUS
  Filled 2015-12-06: qty 1000

## 2015-12-06 MED ORDER — SODIUM CHLORIDE 0.9 % IV SOLN
510.0000 mg | Freq: Once | INTRAVENOUS | Status: AC
  Administered 2015-12-06: 510 mg via INTRAVENOUS
  Filled 2015-12-06: qty 17

## 2016-03-04 ENCOUNTER — Inpatient Hospital Stay: Payer: Medicare Other | Attending: Oncology

## 2016-03-04 DIAGNOSIS — Z7982 Long term (current) use of aspirin: Secondary | ICD-10-CM | POA: Diagnosis not present

## 2016-03-04 DIAGNOSIS — N189 Chronic kidney disease, unspecified: Secondary | ICD-10-CM | POA: Insufficient documentation

## 2016-03-04 DIAGNOSIS — Z9011 Acquired absence of right breast and nipple: Secondary | ICD-10-CM | POA: Diagnosis not present

## 2016-03-04 DIAGNOSIS — E7800 Pure hypercholesterolemia, unspecified: Secondary | ICD-10-CM | POA: Diagnosis not present

## 2016-03-04 DIAGNOSIS — Z90710 Acquired absence of both cervix and uterus: Secondary | ICD-10-CM | POA: Diagnosis not present

## 2016-03-04 DIAGNOSIS — K5090 Crohn's disease, unspecified, without complications: Secondary | ICD-10-CM | POA: Diagnosis not present

## 2016-03-04 DIAGNOSIS — M069 Rheumatoid arthritis, unspecified: Secondary | ICD-10-CM | POA: Insufficient documentation

## 2016-03-04 DIAGNOSIS — Z7984 Long term (current) use of oral hypoglycemic drugs: Secondary | ICD-10-CM | POA: Insufficient documentation

## 2016-03-04 DIAGNOSIS — Q2733 Arteriovenous malformation of digestive system vessel: Secondary | ICD-10-CM | POA: Diagnosis not present

## 2016-03-04 DIAGNOSIS — I129 Hypertensive chronic kidney disease with stage 1 through stage 4 chronic kidney disease, or unspecified chronic kidney disease: Secondary | ICD-10-CM | POA: Diagnosis not present

## 2016-03-04 DIAGNOSIS — Z90722 Acquired absence of ovaries, bilateral: Secondary | ICD-10-CM | POA: Insufficient documentation

## 2016-03-04 DIAGNOSIS — E119 Type 2 diabetes mellitus without complications: Secondary | ICD-10-CM | POA: Insufficient documentation

## 2016-03-04 DIAGNOSIS — D509 Iron deficiency anemia, unspecified: Secondary | ICD-10-CM | POA: Insufficient documentation

## 2016-03-04 DIAGNOSIS — Z853 Personal history of malignant neoplasm of breast: Secondary | ICD-10-CM | POA: Diagnosis not present

## 2016-03-04 DIAGNOSIS — Z79899 Other long term (current) drug therapy: Secondary | ICD-10-CM | POA: Insufficient documentation

## 2016-03-04 DIAGNOSIS — R062 Wheezing: Secondary | ICD-10-CM

## 2016-03-04 LAB — CBC WITH DIFFERENTIAL/PLATELET
BASOS ABS: 0.1 10*3/uL (ref 0–0.1)
Eosinophils Absolute: 0.1 10*3/uL (ref 0–0.7)
HEMATOCRIT: 39.3 % (ref 35.0–47.0)
Hemoglobin: 13.1 g/dL (ref 12.0–16.0)
Lymphocytes Relative: 27 %
Lymphs Abs: 2.5 10*3/uL (ref 1.0–3.6)
MCH: 29.6 pg (ref 26.0–34.0)
MCHC: 33.4 g/dL (ref 32.0–36.0)
MCV: 88.6 fL (ref 80.0–100.0)
MONO ABS: 1 10*3/uL — AB (ref 0.2–0.9)
Neutro Abs: 5.7 10*3/uL (ref 1.4–6.5)
Neutrophils Relative %: 61 %
Platelets: 204 10*3/uL (ref 150–440)
RBC: 4.43 MIL/uL (ref 3.80–5.20)
RDW: 16.7 % — AB (ref 11.5–14.5)
WBC: 9.3 10*3/uL (ref 3.6–11.0)

## 2016-03-04 LAB — IRON AND TIBC
Iron: 46 ug/dL (ref 28–170)
SATURATION RATIOS: 20 % (ref 10.4–31.8)
TIBC: 233 ug/dL — ABNORMAL LOW (ref 250–450)
UIBC: 187 ug/dL

## 2016-03-04 LAB — FERRITIN: FERRITIN: 137 ng/mL (ref 11–307)

## 2016-03-06 ENCOUNTER — Inpatient Hospital Stay (HOSPITAL_BASED_OUTPATIENT_CLINIC_OR_DEPARTMENT_OTHER): Payer: Medicare Other | Admitting: Oncology

## 2016-03-06 VITALS — BP 130/76 | HR 65 | Temp 98.1°F | Ht 67.0 in | Wt 191.5 lb

## 2016-03-06 DIAGNOSIS — C50912 Malignant neoplasm of unspecified site of left female breast: Secondary | ICD-10-CM

## 2016-03-06 DIAGNOSIS — Z7982 Long term (current) use of aspirin: Secondary | ICD-10-CM

## 2016-03-06 DIAGNOSIS — D509 Iron deficiency anemia, unspecified: Secondary | ICD-10-CM

## 2016-03-06 DIAGNOSIS — Q2733 Arteriovenous malformation of digestive system vessel: Secondary | ICD-10-CM

## 2016-03-06 DIAGNOSIS — Z853 Personal history of malignant neoplasm of breast: Secondary | ICD-10-CM | POA: Diagnosis not present

## 2016-03-06 DIAGNOSIS — Z79899 Other long term (current) drug therapy: Secondary | ICD-10-CM

## 2016-03-06 DIAGNOSIS — N189 Chronic kidney disease, unspecified: Secondary | ICD-10-CM

## 2016-03-06 DIAGNOSIS — Z90710 Acquired absence of both cervix and uterus: Secondary | ICD-10-CM

## 2016-03-06 DIAGNOSIS — K5090 Crohn's disease, unspecified, without complications: Secondary | ICD-10-CM

## 2016-03-06 DIAGNOSIS — I129 Hypertensive chronic kidney disease with stage 1 through stage 4 chronic kidney disease, or unspecified chronic kidney disease: Secondary | ICD-10-CM | POA: Diagnosis not present

## 2016-03-06 DIAGNOSIS — E7800 Pure hypercholesterolemia, unspecified: Secondary | ICD-10-CM

## 2016-03-06 DIAGNOSIS — Z9011 Acquired absence of right breast and nipple: Secondary | ICD-10-CM

## 2016-03-06 DIAGNOSIS — Z90722 Acquired absence of ovaries, bilateral: Secondary | ICD-10-CM

## 2016-03-06 DIAGNOSIS — Z7984 Long term (current) use of oral hypoglycemic drugs: Secondary | ICD-10-CM

## 2016-03-06 DIAGNOSIS — E119 Type 2 diabetes mellitus without complications: Secondary | ICD-10-CM

## 2016-03-06 DIAGNOSIS — M069 Rheumatoid arthritis, unspecified: Secondary | ICD-10-CM

## 2016-03-06 NOTE — Progress Notes (Signed)
Patient here for follow up. No concerns today. 

## 2016-03-09 ENCOUNTER — Other Ambulatory Visit: Payer: Self-pay | Admitting: Oncology

## 2016-03-09 DIAGNOSIS — Z1231 Encounter for screening mammogram for malignant neoplasm of breast: Secondary | ICD-10-CM

## 2016-03-15 NOTE — Progress Notes (Signed)
[place]  [phone] Fax:(336) [phone]  ID: Rebecca Barajas OB: 25-Jan-1934  MR#: [id number]  [id number]  No care team member to display  CHIEF COMPLAINT:  Chief Complaint  Patient presents with  . IDA  . Follow-up    INTERVAL HISTORY: Patient returns to clinic today for further evaluation and laboratory work. She currently feels well and is asymptomatic. She has no neurologic complaints. She denies any recent fevers or illnesses. She has good appetite and denies weight loss. She denies any chest pain or shortness of breath. She denies any nausea, vomiting, constipation, or diarrhea. She has no melena or hematochezia. She has no urinary complaints. Patient feels at her baseline and offers no specific complaints today.      REVIEW OF SYSTEMS:   Review of Systems  Constitutional: Negative.  Negative for fever, weight loss and malaise/fatigue.  Respiratory: Negative.  Negative for cough and shortness of breath.   Cardiovascular: Negative.  Negative for chest pain.  Gastrointestinal: Negative.  Negative for abdominal pain, blood in stool and melena.  Genitourinary: Negative.   Musculoskeletal: Negative.   Neurological: Negative.  Negative for weakness.  Psychiatric/Behavioral: Negative.     As per HPI. Otherwise, a complete review of systems is negatve.  PAST MEDICAL HISTORY:  Rheumatoid arthritis, Crohn's, hypertension, hypercholesterolemia, diabetes, recurrent breast cancer, Gastric AVMs.  appendectomy, right lumpectomy, total hysterectomy, right mastectomy, total knee replacement  FAMILY HISTORY: 2 sisters with breast cancer, daughter with breast cancer.     ADVANCED DIRECTIVES:    HEALTH MAINTENANCE: Social History  Substance Use Topics  . Smoking status: Never Smoker   . Smokeless tobacco: Never Used  . Alcohol Use: No    Allergies  Allergen Reactions  . Levofloxacin Other (See Comments) and Diarrhea    Diarrhea, nausea,  rectal bleeding, mouth sores DIARRHEA WITH RECTAL BLEEDING Other reaction(s): Bleeding Diarrhea, nausea, rectal bleeding, mouth sores  . Antihistamines, Diphenhydramine-Type Other (See Comments)    hypertension  . Amlodipine Itching    Intolerant Intolerant  . Fosinopril Itching    Intolerant  . Fosinopril Sodium-Hctz     Intolerant  . Loratadine Other (See Comments)    all antihistamines  . Methotrexate Diarrhea  . Nsaids Diarrhea    Diarrhea/abdominal cramping Diarrhea/abdominal cramping    Current Outpatient Prescriptions  Medication Sig Dispense Refill  . aspirin 81 MG tablet Take 81 mg by mouth.    Marland Kitchen atenolol (TENORMIN) 25 MG tablet TAKE ONE TABLET BY MOUTH TWICE DAILY.    Marland Kitchen Cholecalciferol (VITAMIN D) 2000 UNITS tablet Take 2,000 Units by mouth.    . ezetimibe-simvastatin (VYTORIN) 10-20 MG per tablet Take 1 tablet by mouth.    . folic acid (FOLVITE) 1 MG tablet TAKE (1) TABLET BY MOUTH EVERY DAY    . glipiZIDE (GLUCOTROL XL) 10 MG 24 hr tablet Take by mouth.    . hydrochlorothiazide (HYDRODIURIL) 12.5 MG tablet Take by mouth.    . irbesartan (AVAPRO) 150 MG tablet Take 150 mg by mouth.    . Multiple Vitamins-Calcium (VIACTIV MULTI-VITAMIN) CHEW Chew by mouth.    . Omeprazole 20 MG TBEC Take by mouth.    . predniSONE (DELTASONE) 5 MG tablet Take 5 mg by mouth.    . ranitidine (ZANTAC) 300 MG tablet Take by mouth.    . sitaGLIPtin (JANUVIA) 25 MG tablet Take by mouth.    . hydrochlorothiazide (HYDRODIURIL) 12.5 MG tablet Take 12.5 mg by mouth.    . sulfaSALAzine (AZULFIDINE) 500 MG  tablet Take by mouth.     No current facility-administered medications for this visit.    OBJECTIVE: Filed Vitals:   03/06/16 0942  BP: 130/76  Pulse: 65  Temp: 98.1 F (36.7 C)     Body mass index is 29.98 kg/(m^2).    ECOG FS:0 - Asymptomatic  General: Well-developed, well-nourished, no acute distress. Eyes: anicteric sclera. Lungs: anterior inspiratory wheezing  bilaterally. Heart: Regular rate and rhythm. No rubs, murmurs, or gallops. Abdomen: Soft, nontender, nondistended. No organomegaly noted, normoactive bowel sounds. Musculoskeletal: No edema, cyanosis, or clubbing. Neuro: Alert, answering all questions appropriately. Cranial nerves grossly intact. Skin: No rashes or petechiae noted. Psych: Normal affect.   LAB RESULTS:  Lab Results  Component Value Date   NA 136 01/09/2015   K 4.1 01/09/2015   CL 99* 01/09/2015   CO2 26 01/09/2015   GLUCOSE 192* 01/09/2015   BUN 26* 01/09/2015   CREATININE 1.30* 01/09/2015   CALCIUM 8.9 01/09/2015   PROT 6.6 01/09/2015   ALBUMIN 3.7 01/09/2015   AST 24 01/09/2015   ALT 21 01/09/2015   ALKPHOS 68 01/09/2015   BILITOT 0.3 01/09/2015   GFRNONAA 39* 01/09/2015   GFRAA 46* 01/09/2015    Lab Results  Component Value Date   WBC 9.3 03/04/2016   NEUTROABS 5.7 03/04/2016   HGB 13.1 03/04/2016   HCT 39.3 03/04/2016   MCV 88.6 03/04/2016   PLT 204 03/04/2016   Lab Results  Component Value Date   IRON 46 03/04/2016   TIBC 233* 03/04/2016   IRONPCTSAT 20 03/04/2016    Lab Results  Component Value Date   FERRITIN 137 03/04/2016     STUDIES: No results found.  ASSESSMENT:  Iron deficiency anemia and history of breast cancer.  PLAN:    1.  Iron deficiency anemia: Patient's hemoglobin and iron stores are now within normal limits. She last received Feraheme in March 2017. No intervention is needed at this time. Return to clinic in 3 months for laboratory work and then in 6 months for laboratory work, further evaluation, and consideration of additional IV iron. Given patient's mild renal insufficiency, she may benefit from Procrit in the future as well if her hemoglobin falls below 10.0.  2.  Breast cancer:  Patient has discontinued her Arimidex and continues to refuse any other hormonal therapy, including tamoxifen.  Her most recent mammogram on January 17, 2015 was reported as BI-RADS 1.   Repeat in the next 1-2 weeks.  3. Chronic renal insufficiency: Patient's creatinine is approximately her baseline, monitor.  Patient expressed understanding and was in agreement with this plan. She also understands that She can call clinic at any time with any questions, concerns, or complaints.     Lloyd Huger, MD   03/15/2016 8:01 AM

## 2016-03-18 ENCOUNTER — Other Ambulatory Visit: Payer: Self-pay | Admitting: Oncology

## 2016-03-18 ENCOUNTER — Ambulatory Visit
Admission: RE | Admit: 2016-03-18 | Discharge: 2016-03-18 | Disposition: A | Discharge status: Home / Self Care | Payer: Medicare Other | Source: Ambulatory Visit | Attending: Oncology | Admitting: Oncology

## 2016-03-18 DIAGNOSIS — Z9011 Acquired absence of right breast and nipple: Secondary | ICD-10-CM | POA: Insufficient documentation

## 2016-03-18 DIAGNOSIS — Z853 Personal history of malignant neoplasm of breast: Secondary | ICD-10-CM | POA: Insufficient documentation

## 2016-03-18 DIAGNOSIS — Z1231 Encounter for screening mammogram for malignant neoplasm of breast: Secondary | ICD-10-CM | POA: Diagnosis not present

## 2016-06-12 ENCOUNTER — Inpatient Hospital Stay: Payer: Medicare Other

## 2016-06-16 ENCOUNTER — Inpatient Hospital Stay: Payer: Medicare Other | Attending: Oncology

## 2016-06-16 DIAGNOSIS — D509 Iron deficiency anemia, unspecified: Secondary | ICD-10-CM

## 2016-06-16 LAB — CBC WITH DIFFERENTIAL/PLATELET
Basophils Absolute: 0 10*3/uL (ref 0–0.1)
Basophils Relative: 0 %
EOS PCT: 1 %
Eosinophils Absolute: 0.1 10*3/uL (ref 0–0.7)
HCT: 37.6 % (ref 35.0–47.0)
Hemoglobin: 12.5 g/dL (ref 12.0–16.0)
LYMPHS ABS: 2 10*3/uL (ref 1.0–3.6)
LYMPHS PCT: 21 %
MCH: 30.5 pg (ref 26.0–34.0)
MCHC: 33.3 g/dL (ref 32.0–36.0)
MCV: 91.4 fL (ref 80.0–100.0)
MONO ABS: 0.8 10*3/uL (ref 0.2–0.9)
Monocytes Relative: 9 %
Neutro Abs: 6.4 10*3/uL (ref 1.4–6.5)
Neutrophils Relative %: 69 %
PLATELETS: 225 10*3/uL (ref 150–440)
RBC: 4.11 MIL/uL (ref 3.80–5.20)
RDW: 14.7 % — ABNORMAL HIGH (ref 11.5–14.5)
WBC: 9.3 10*3/uL (ref 3.6–11.0)

## 2016-06-16 LAB — IRON AND TIBC
IRON: 73 ug/dL (ref 28–170)
Saturation Ratios: 28 % (ref 10.4–31.8)
TIBC: 261 ug/dL (ref 250–450)
UIBC: 188 ug/dL

## 2016-06-16 LAB — FERRITIN: FERRITIN: 209 ng/mL (ref 11–307)

## 2016-06-17 ENCOUNTER — Other Ambulatory Visit: Payer: Medicare Other

## 2016-07-04 IMAGING — MG MM DIGITAL SCREENING UNILAT*L* W/ TOMO W/ CAD
6 of 9 series · 6 of 21 positions shown · non-contrast
Comparison: Previous exam(s).

CLINICAL DATA: Screening. History of right breast cancer in 9224
status post mastectomy.

EXAM:
2D DIGITAL SCREENING UNILATERAL LEFT MAMMOGRAM WITH CAD AND ADJUNCT
TOMO

[L CC synth-2D (1 of 2)]
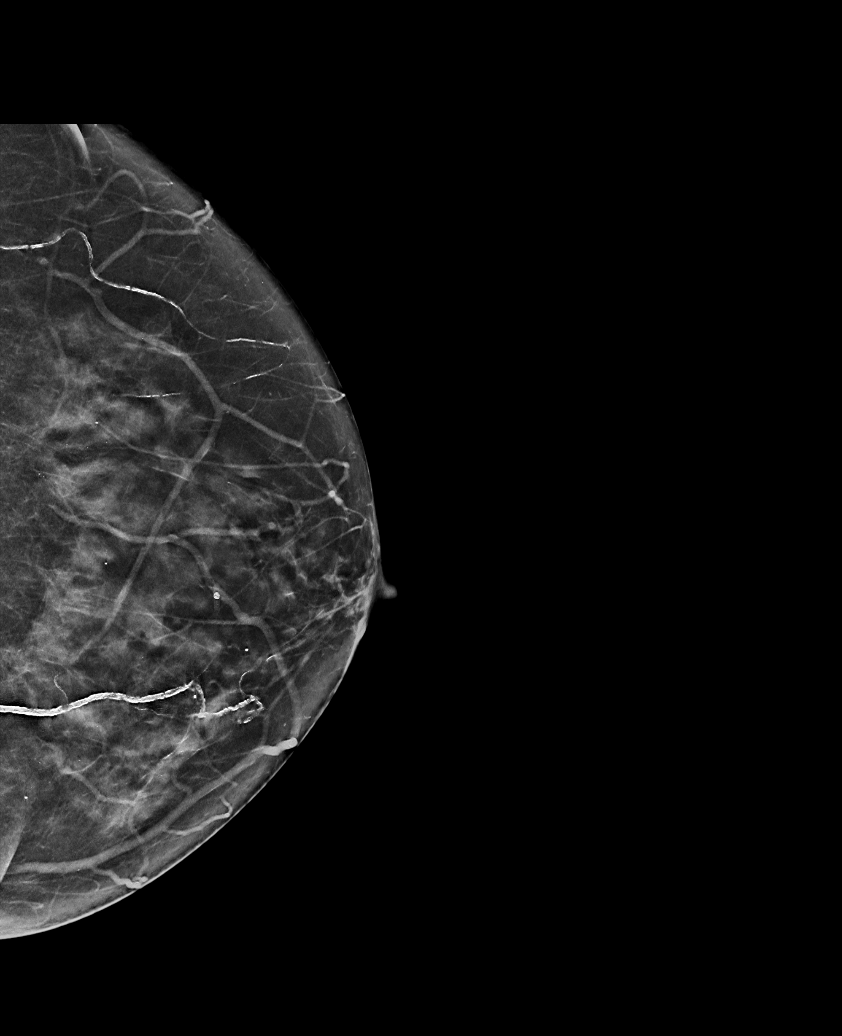

[L CC synth-2D (2 of 2)]
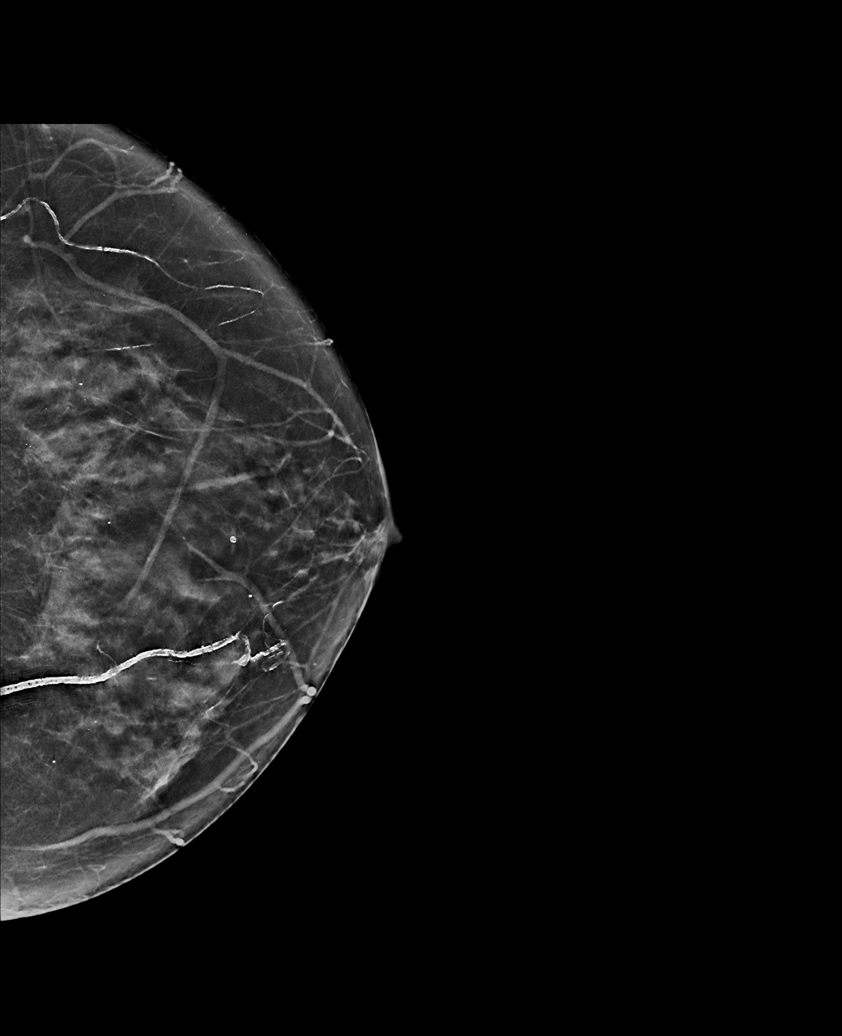

[L MLO synth-2D]
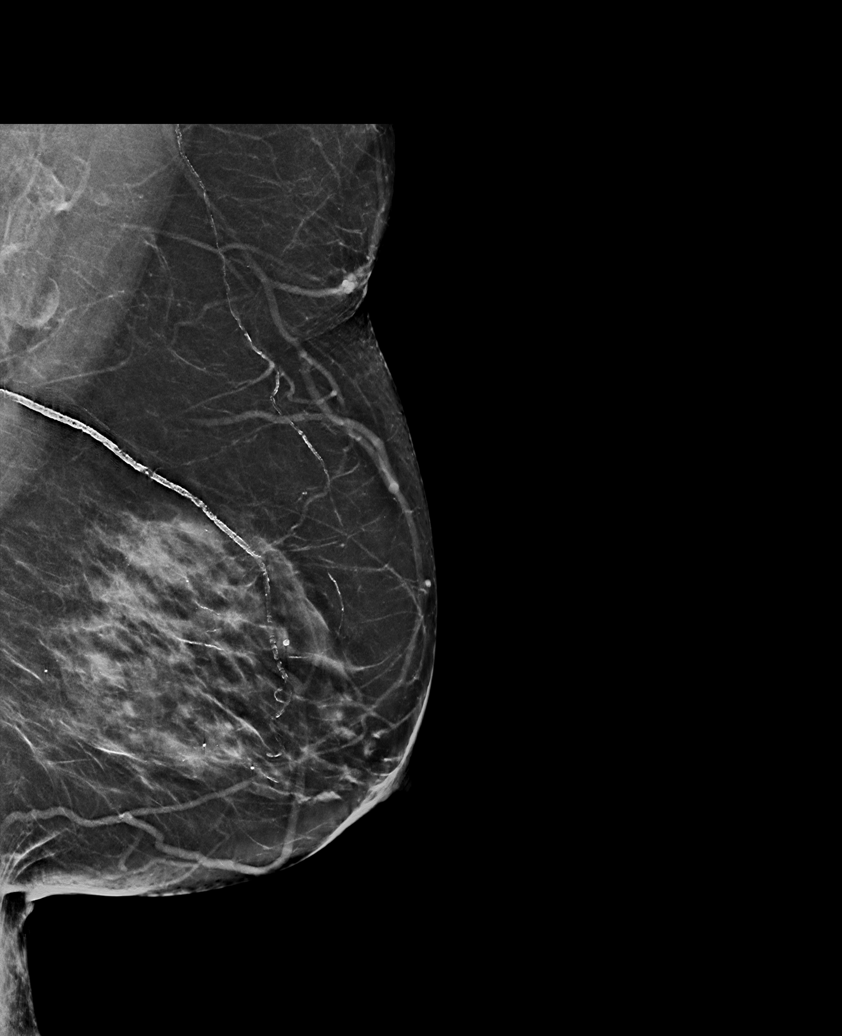

[L CC (1 of 2)]
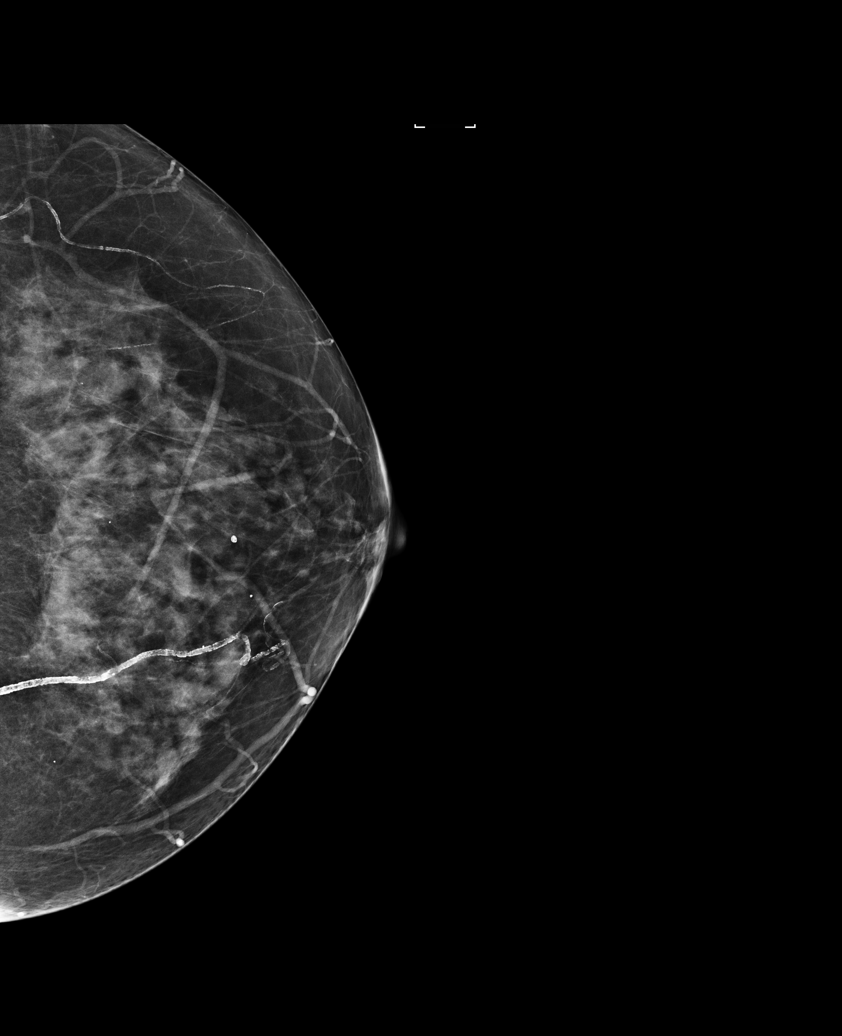

[L MLO]
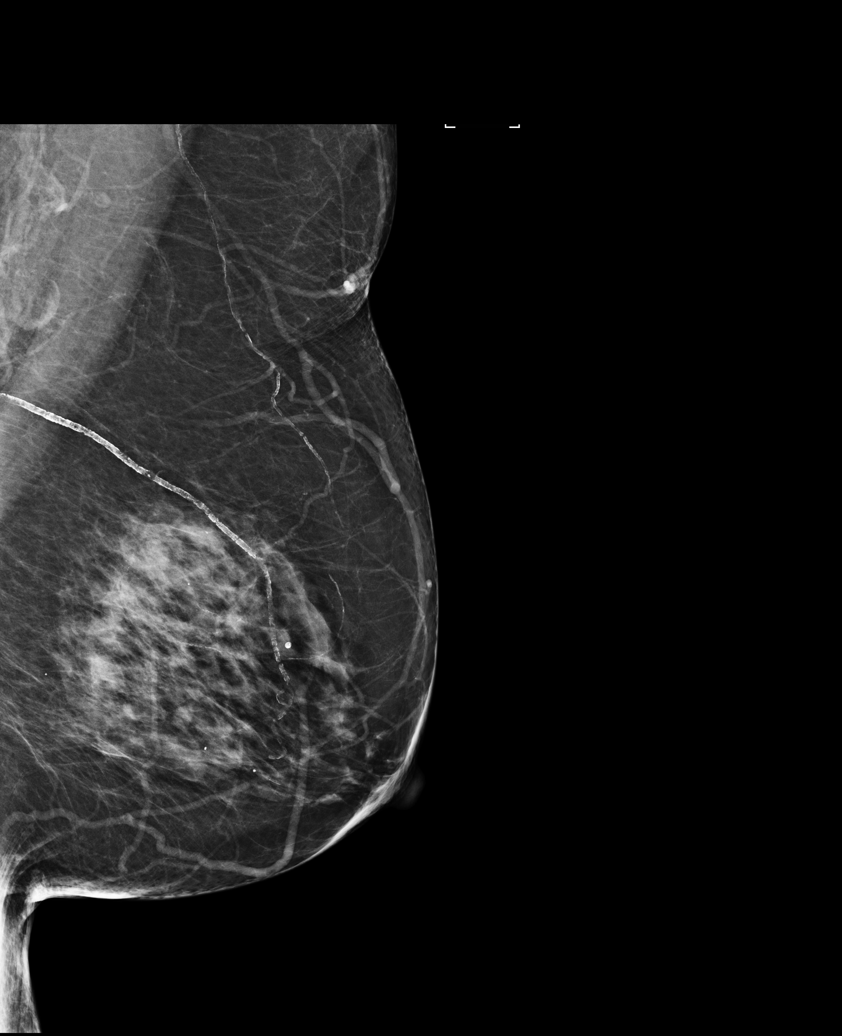

[L CC (2 of 2)]
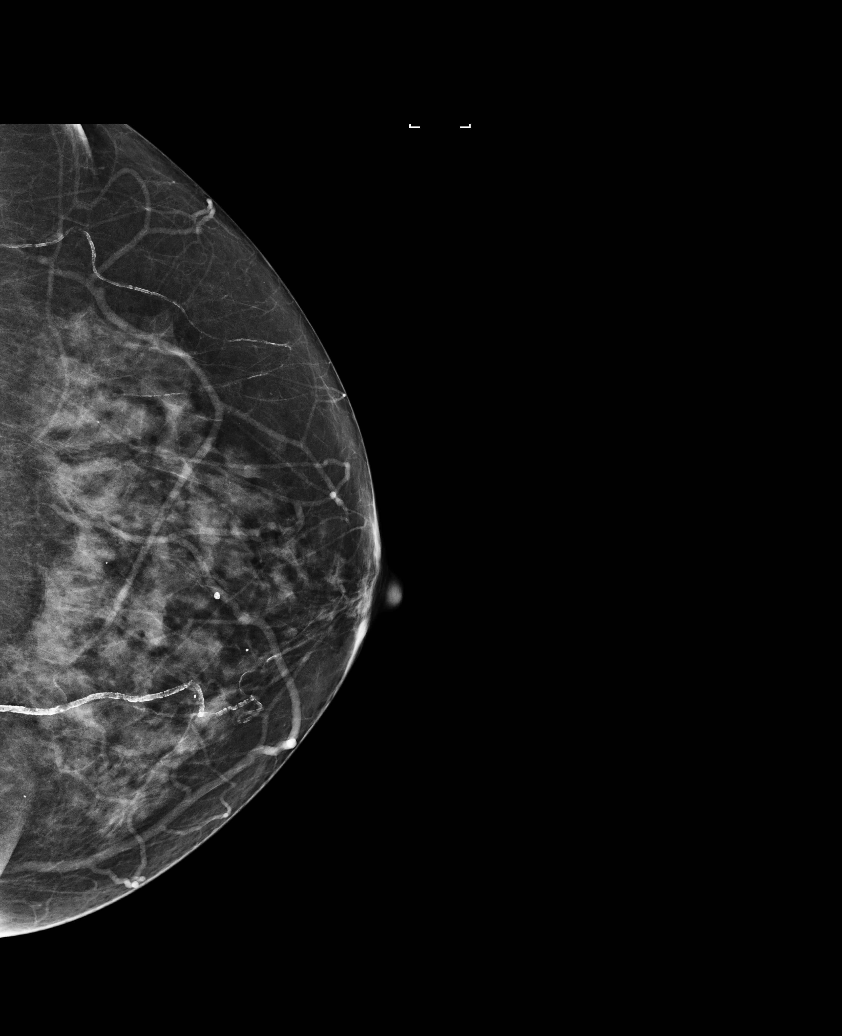

[6 of 21 positions shown; findings below may reference images not displayed]

ACR Breast Density Category c: The breast tissue is heterogeneously
dense, which may obscure small masses.
FINDINGS: The patient has had a right mastectomy. There are no findings
suspicious for malignancy within the left breast.

Images were processed with CAD.
IMPRESSION: No mammographic evidence of malignancy. A result letter of this
screening mammogram will be mailed directly to the patient.

RECOMMENDATION:
Screening mammogram in one year.  (Code:QC-J-PWC)

BI-RADS CATEGORY  1: Negative.

## 2016-09-02 ENCOUNTER — Other Ambulatory Visit: Payer: Medicare Other

## 2016-09-04 ENCOUNTER — Ambulatory Visit: Payer: Medicare Other | Admitting: Oncology

## 2016-09-04 ENCOUNTER — Ambulatory Visit: Payer: Medicare Other

## 2016-09-09 ENCOUNTER — Inpatient Hospital Stay: Payer: Medicare Other | Attending: Oncology

## 2016-09-09 NOTE — Progress Notes (Deleted)
[place]  Telephone:(336) [phone] Fax:(336) [phone]  ID: Rebecca Barajas OB: 19-Jun-1934  MR#: [id number]  [id number]  No care team member to display  CHIEF COMPLAINT: Iron deficiency anemia and history of breast cancer.  INTERVAL HISTORY: Patient returns to clinic today for further evaluation and laboratory work. She currently feels well and is asymptomatic. She has no neurologic complaints. She denies any recent fevers or illnesses. She has good appetite and denies weight loss. She denies any chest pain or shortness of breath. She denies any nausea, vomiting, constipation, or diarrhea. She has no melena or hematochezia. She has no urinary complaints. Patient feels at her baseline and offers no specific complaints today.      REVIEW OF SYSTEMS:   Review of Systems  Constitutional: Negative.  Negative for fever, malaise/fatigue and weight loss.  Respiratory: Negative.  Negative for cough and shortness of breath.   Cardiovascular: Negative.  Negative for chest pain.  Gastrointestinal: Negative.  Negative for abdominal pain, blood in stool and melena.  Genitourinary: Negative.   Musculoskeletal: Negative.   Neurological: Negative.  Negative for weakness.  Psychiatric/Behavioral: Negative.     As per HPI. Otherwise, a complete review of systems is negatve.  PAST MEDICAL HISTORY:  Rheumatoid arthritis, Crohn's, hypertension, hypercholesterolemia, diabetes, recurrent breast cancer, Gastric AVMs.  appendectomy, right lumpectomy, total hysterectomy, right mastectomy, total knee replacement  FAMILY HISTORY: 2 sisters with breast cancer, daughter with breast cancer.     ADVANCED DIRECTIVES:    HEALTH MAINTENANCE: Social History  Substance Use Topics  . Smoking status: Never Smoker  . Smokeless tobacco: Never Used  . Alcohol use No    Allergies  Allergen Reactions  . Levofloxacin Other (See Comments) and Diarrhea    Diarrhea, nausea, rectal bleeding,  mouth sores DIARRHEA WITH RECTAL BLEEDING Other reaction(s): Bleeding Diarrhea, nausea, rectal bleeding, mouth sores  . Antihistamines, Diphenhydramine-Type Other (See Comments)    hypertension  . Amlodipine Itching    Intolerant Intolerant  . Fosinopril Itching    Intolerant  . Fosinopril Sodium-Hctz     Intolerant  . Loratadine Other (See Comments)    all antihistamines  . Methotrexate Diarrhea  . Nsaids Diarrhea    Diarrhea/abdominal cramping Diarrhea/abdominal cramping    Current Outpatient Prescriptions  Medication Sig Dispense Refill  . aspirin 81 MG tablet Take 81 mg by mouth.    Marland Kitchen atenolol (TENORMIN) 25 MG tablet TAKE ONE TABLET BY MOUTH TWICE DAILY.    Marland Kitchen Cholecalciferol (VITAMIN D) 2000 UNITS tablet Take 2,000 Units by mouth.    . ezetimibe-simvastatin (VYTORIN) 10-20 MG per tablet Take 1 tablet by mouth.    . folic acid (FOLVITE) 1 MG tablet TAKE (1) TABLET BY MOUTH EVERY DAY    . glipiZIDE (GLUCOTROL XL) 10 MG 24 hr tablet Take by mouth.    . hydrochlorothiazide (HYDRODIURIL) 12.5 MG tablet Take 12.5 mg by mouth.    . hydrochlorothiazide (HYDRODIURIL) 12.5 MG tablet Take by mouth.    . irbesartan (AVAPRO) 150 MG tablet Take 150 mg by mouth.    . Multiple Vitamins-Calcium (VIACTIV MULTI-VITAMIN) CHEW Chew by mouth.    . Omeprazole 20 MG TBEC Take by mouth.    . predniSONE (DELTASONE) 5 MG tablet Take 5 mg by mouth.    . ranitidine (ZANTAC) 300 MG tablet Take by mouth.    . sitaGLIPtin (JANUVIA) 25 MG tablet Take by mouth.    . sulfaSALAzine (AZULFIDINE) 500 MG tablet Take by mouth.  No current facility-administered medications for this visit.     OBJECTIVE: There were no vitals filed for this visit.   There is no height or weight on file to calculate BMI.    ECOG FS:0 - Asymptomatic  General: Well-developed, well-nourished, no acute distress. Eyes: anicteric sclera. Lungs: anterior inspiratory wheezing bilaterally. Heart: Regular rate and rhythm. No rubs,  murmurs, or gallops. Abdomen: Soft, nontender, nondistended. No organomegaly noted, normoactive bowel sounds. Musculoskeletal: No edema, cyanosis, or clubbing. Neuro: Alert, answering all questions appropriately. Cranial nerves grossly intact. Skin: No rashes or petechiae noted. Psych: Normal affect.   LAB RESULTS:  Lab Results  Component Value Date   NA 136 01/09/2015   K 4.1 01/09/2015   CL 99 (L) 01/09/2015   CO2 26 01/09/2015   GLUCOSE 192 (H) 01/09/2015   BUN 26 (H) 01/09/2015   CREATININE 1.30 (H) 01/09/2015   CALCIUM 8.9 01/09/2015   PROT 6.6 01/09/2015   ALBUMIN 3.7 01/09/2015   AST 24 01/09/2015   ALT 21 01/09/2015   ALKPHOS 68 01/09/2015   BILITOT 0.3 01/09/2015   GFRNONAA 39 (L) 01/09/2015   GFRAA 46 (L) 01/09/2015    Lab Results  Component Value Date   WBC 9.3 06/16/2016   NEUTROABS 6.4 06/16/2016   HGB 12.5 06/16/2016   HCT 37.6 06/16/2016   MCV 91.4 06/16/2016   PLT 225 06/16/2016   Lab Results  Component Value Date   IRON 73 06/16/2016   TIBC 261 06/16/2016   IRONPCTSAT 28 06/16/2016    Lab Results  Component Value Date   FERRITIN 209 06/16/2016     STUDIES: No results found.  ASSESSMENT:  Iron deficiency anemia and history of breast cancer.  PLAN:    1.  Iron deficiency anemia: Patient's hemoglobin and iron stores are now within normal limits. She last received Feraheme in March 2017. No intervention is needed at this time. Return to clinic in 3 months for laboratory work and then in 6 months for laboratory work, further evaluation, and consideration of additional IV iron. Given patient's mild renal insufficiency, she may benefit from Procrit in the future as well if her hemoglobin falls below 10.0.  2.  Breast cancer:  Patient has discontinued her Arimidex and continues to refuse any other hormonal therapy, including tamoxifen.  Her most recent mammogram on January 17, 2015 was reported as BI-RADS 1.  Repeat in the next 1-2 weeks.  3.  Chronic renal insufficiency: Patient's creatinine is approximately her baseline, monitor.  Patient expressed understanding and was in agreement with this plan. She also understands that She can call clinic at any time with any questions, concerns, or complaints.     Lloyd Huger, MD   09/09/2016 11:47 PM

## 2016-09-11 ENCOUNTER — Ambulatory Visit: Payer: Medicare Other

## 2016-09-11 ENCOUNTER — Ambulatory Visit: Payer: Medicare Other | Admitting: Oncology

## 2017-02-10 ENCOUNTER — Other Ambulatory Visit: Payer: Self-pay | Admitting: Internal Medicine

## 2017-02-10 DIAGNOSIS — Z1231 Encounter for screening mammogram for malignant neoplasm of breast: Secondary | ICD-10-CM

## 2017-03-05 ENCOUNTER — Encounter: Payer: Self-pay | Admitting: Emergency Medicine

## 2017-03-05 ENCOUNTER — Ambulatory Visit
Admission: EM | Admit: 2017-03-05 | Discharge: 2017-03-05 | Disposition: A | Discharge status: Home / Self Care | Payer: Medicare Other | Attending: Family Medicine | Admitting: Family Medicine

## 2017-03-05 ENCOUNTER — Ambulatory Visit: Payer: Medicare Other

## 2017-03-05 DIAGNOSIS — X58XXXA Exposure to other specified factors, initial encounter: Secondary | ICD-10-CM | POA: Diagnosis not present

## 2017-03-05 DIAGNOSIS — M79672 Pain in left foot: Secondary | ICD-10-CM | POA: Diagnosis present

## 2017-03-05 DIAGNOSIS — S92415A Nondisplaced fracture of proximal phalanx of left great toe, initial encounter for closed fracture: Secondary | ICD-10-CM

## 2017-03-05 DIAGNOSIS — S92411A Displaced fracture of proximal phalanx of right great toe, initial encounter for closed fracture: Secondary | ICD-10-CM | POA: Diagnosis not present

## 2017-03-05 NOTE — ED Triage Notes (Signed)
Patient c/o left foot pain and redness that started yesterday.

## 2017-03-05 NOTE — ED Provider Notes (Signed)
CSN: [id number]     Arrival date & time 03/05/17  1303 History   None    Chief Complaint  Patient presents with  . Foot Pain   (Consider location/radiation/quality/duration/timing/severity/associated sxs/prior Treatment) HPI  This is an 83-year-old female who presents with left foot pain over her first MTP joint with associated redness warmth and tenderness that started yesterday. She states she has a history of stress fractures in her feet and has advanced osteoarthritis and rheumatoid arthritis. She denies any recent increased walking or standing. The pain started yesterday and was more red and tender today.       Past Medical History:  Diagnosis Date  . Anemia   . Breast cancer, right breast (HCC)    . Initially diagnosed in 1998, stage IA, T1cN0Mx, ER positive, PR negative, HER2 unamplified of the right breast. Lumpectomy and axilla node dissection And radiation, no adjuvant treatment secondary to size of tumor. B. 07/27/11, annual mammogram: asymmetry lateral right breast. Asymmetry inferior aspect of right breast unchanged from prior studies, deep to region of scar marker. No masses, calci  . CAD (coronary artery disease)   . Coronary artery disease   . Crohn's disease (HCC)   . Diabetes mellitus type 2 in nonobese (HCC)   . Esophageal stricture   . GERD (gastroesophageal reflux disease)   . Hyperlipidemia   . Hypertension   . Inflammatory bowel disease    with high-grade stenmanifested by Crohn's disease diagnosed in 1999  . Osteoarthritis   . Rheumatoid arthritis (HCC)    Past Surgical History:  Procedure Laterality Date  . ABDOMINAL HYSTERECTOMY    . APPENDECTOMY    . BREAST BIOPSY Right 2012   Positive  . BREAST BIOPSY Left 20+ years ago   Negative  . CAROTID ENDARTERECTOMY    . MASTECTOMY PARTIAL / LUMPECTOMY W/ AXILLARY LYMPHADENECTOMY  2013  . SIMPLE MASTECTOMY    . TONSILLECTOMY AND ADENOIDECTOMY    . TOTAL KNEE ARTHROPLASTY    . VASCULAR SURGERY      Family History  Problem Relation Age of Onset  . Uterine cancer Sister   . Breast cancer Maternal Aunt   . Breast cancer Paternal Aunt   . Breast cancer Maternal Aunt   . Alzheimer's disease Mother   . Hypertension Mother   . Hypertension Father   . Gallbladder disease Father   . Alzheimer's disease Father   . Breast cancer Daughter    Social History  Substance Use Topics  . Smoking status: Never Smoker  . Smokeless tobacco: Never Used  . Alcohol use No   OB History    No data available     Review of Systems  Constitutional: Positive for activity change. Negative for appetite change, fatigue and fever.  Musculoskeletal: Positive for arthralgias and joint swelling.  All other systems reviewed and are negative.   Allergies  Levofloxacin; Antihistamines, diphenhydramine-type; Amlodipine; Fosinopril; Fosinopril sodium-hctz; Loratadine; Methotrexate; and Nsaids  Home Medications   Prior to Admission medications   Medication Sig Start Date End Date Taking? Authorizing Provider  aspirin 81 MG tablet Take 81 mg by mouth.    [provider]  atenolol (TENORMIN) 25 MG tablet TAKE ONE TABLET BY MOUTH TWICE DAILY. 03/18/15   [provider]  Cholecalciferol (VITAMIN D) 2000 UNITS tablet Take 2,000 Units by mouth. 04/11/12   [provider]  ezetimibe-simvastatin (VYTORIN) 10-20 MG per tablet Take 1 tablet by mouth. 11/29/14   [provider]  folic acid (FOLVITE) 1   MG tablet TAKE (1) TABLET BY MOUTH EVERY DAY 02/04/15   [provider]  glipiZIDE (GLUCOTROL XL) 10 MG 24 hr tablet Take by mouth. 01/21/16   [provider]  hydrochlorothiazide (HYDRODIURIL) 12.5 MG tablet Take 12.5 mg by mouth. 11/29/14 11/29/15  [provider]  hydrochlorothiazide (HYDRODIURIL) 12.5 MG tablet Take by mouth. 11/29/14   [provider]  irbesartan (AVAPRO) 150 MG tablet Take 150 mg by mouth. 01/10/15 01/10/19  [provider]   Multiple Vitamins-Calcium (VIACTIV MULTI-VITAMIN) CHEW Chew by mouth.    [provider]  Omeprazole 20 MG TBEC Take by mouth.    [provider]  predniSONE (DELTASONE) 5 MG tablet Take 5 mg by mouth.    [provider]  ranitidine (ZANTAC) 300 MG tablet Take by mouth. 02/28/16 02/27/17  [provider]  sitaGLIPtin (JANUVIA) 25 MG tablet Take by mouth. 01/21/16 01/20/17  [provider]  sulfaSALAzine (AZULFIDINE) 500 MG tablet Take by mouth.    [provider]   Meds Ordered and Administered this Visit  Medications - No data to display  BP 135/74 (BP Location: Left Arm)   Pulse (!) 57   Temp 98 F (36.7 C) (Oral)   Resp 16   Ht 5' 7" (1.702 m)   Wt 180 lb (81.6 kg)   SpO2 99%   BMI 28.19 kg/m  No data found.   Physical Exam  Constitutional: She is oriented to person, place, and time. She appears well-developed and well-nourished. No distress.  HENT:  Head: Normocephalic and atraumatic.  Eyes: Pupils are equal, round, and reactive to light.  Neck: Normal range of motion.  Musculoskeletal: She exhibits edema and tenderness. She exhibits no deformity.  Examination of the left foot shows redness and tenderness mild swelling warmth over the first MTP. He also extends into the proximal phalanx and also the metatarsal head neck. Adjacent joints and metatarsals are not tender. She has good ankle range of motion and good subtalar motion.  Neurological: She is alert and oriented to person, place, and time.  Skin: Skin is warm and dry. She is not diaphoretic.  Psychiatric: She has a normal mood and affect. Her behavior is normal. Thought content normal.  Nursing note and vitals reviewed.   Urgent Care Course     Procedures (including critical care time)  Labs Review Labs Reviewed - No data to display  Imaging Review Dg Foot Complete Left  Result Date: 03/05/2017 CLINICAL DATA:  Patient will left foot pain without known trauma.  History of stress fractures on the right. EXAM: LEFT FOOT - COMPLETE 3+ VIEW COMPARISON:  None. FINDINGS: There is a fracture through the base of the first proximal phalanx medially. This was not present in 2011. A healed fracture of the third metatarsal is identified with callus formation. Degenerative changes are seen in the hindfoot. IMPRESSION: There is a fracture through the medial base of the first proximal phalanx. Electronically Signed   By: David  Williams III M.D   On: 03/05/2017 14:45     Visual Acuity Review  Right Eye Distance:   Left Eye Distance:   Bilateral Distance:    Right Eye Near:   Left Eye Near:    Bilateral Near:         MDM   1. Closed nondisplaced fracture of proximal phalanx of left great toe, initial encounter    The patient states that she does have boot orthosis at home that she can wear to help   with the pain when ambulating. She knows to elevate and ice her foot adequately to control swelling and pain. To follow-up with her orthopedist Dr. Toohey  At [facility].    Roemer, William P, PA-C 03/05/17 1506  

## 2017-03-22 ENCOUNTER — Ambulatory Visit
Admission: RE | Admit: 2017-03-22 | Discharge: 2017-03-22 | Disposition: A | Discharge status: Home / Self Care | Payer: Medicare Other | Source: Ambulatory Visit | Attending: Internal Medicine | Admitting: Internal Medicine

## 2017-03-22 DIAGNOSIS — Z1231 Encounter for screening mammogram for malignant neoplasm of breast: Secondary | ICD-10-CM | POA: Diagnosis not present

## 2017-06-04 ENCOUNTER — Ambulatory Visit
Admission: EM | Admit: 2017-06-04 | Discharge: 2017-06-04 | Disposition: A | Discharge status: Home / Self Care | Payer: Medicare Other | Attending: Family Medicine | Admitting: Family Medicine

## 2017-06-04 ENCOUNTER — Ambulatory Visit: Payer: Medicare Other

## 2017-06-04 ENCOUNTER — Encounter: Payer: Self-pay | Admitting: Emergency Medicine

## 2017-06-04 DIAGNOSIS — Z7984 Long term (current) use of oral hypoglycemic drugs: Secondary | ICD-10-CM | POA: Insufficient documentation

## 2017-06-04 DIAGNOSIS — Z7982 Long term (current) use of aspirin: Secondary | ICD-10-CM | POA: Insufficient documentation

## 2017-06-04 DIAGNOSIS — Z853 Personal history of malignant neoplasm of breast: Secondary | ICD-10-CM | POA: Diagnosis not present

## 2017-06-04 DIAGNOSIS — X58XXXA Exposure to other specified factors, initial encounter: Secondary | ICD-10-CM | POA: Insufficient documentation

## 2017-06-04 DIAGNOSIS — M069 Rheumatoid arthritis, unspecified: Secondary | ICD-10-CM | POA: Insufficient documentation

## 2017-06-04 DIAGNOSIS — K5090 Crohn's disease, unspecified, without complications: Secondary | ICD-10-CM | POA: Insufficient documentation

## 2017-06-04 DIAGNOSIS — S9032XA Contusion of left foot, initial encounter: Secondary | ICD-10-CM | POA: Diagnosis not present

## 2017-06-04 DIAGNOSIS — E119 Type 2 diabetes mellitus without complications: Secondary | ICD-10-CM | POA: Insufficient documentation

## 2017-06-04 DIAGNOSIS — I10 Essential (primary) hypertension: Secondary | ICD-10-CM | POA: Insufficient documentation

## 2017-06-04 DIAGNOSIS — I2510 Atherosclerotic heart disease of native coronary artery without angina pectoris: Secondary | ICD-10-CM | POA: Diagnosis not present

## 2017-06-04 DIAGNOSIS — K219 Gastro-esophageal reflux disease without esophagitis: Secondary | ICD-10-CM | POA: Diagnosis not present

## 2017-06-04 DIAGNOSIS — E785 Hyperlipidemia, unspecified: Secondary | ICD-10-CM | POA: Insufficient documentation

## 2017-06-04 DIAGNOSIS — S90122A Contusion of left lesser toe(s) without damage to nail, initial encounter: Secondary | ICD-10-CM | POA: Diagnosis not present

## 2017-06-04 DIAGNOSIS — M79672 Pain in left foot: Secondary | ICD-10-CM | POA: Diagnosis present

## 2017-06-04 NOTE — ED Notes (Signed)
Ultrasound scheduled for today at 3:15pm at [facility].

## 2017-06-04 NOTE — ED Provider Notes (Signed)
[facility]    CSN: [id number] Arrival date & time: 06/04/17  1050     History   Chief Complaint Chief Complaint  Patient presents with  . Foot Pain    left    HPI Rebecca Barajas is a 83 y.o. female.   HPI  Is a 83 year old female who is well known to our clinic. States about a week ago she stepped wrongly on the outside of her foot injuring the base of her fifth metatarsal. At that time she had pain redness and swelling. Any ambulation is very painful to her. She has tried wearing a short orthotic boot and also has a larger one at home if necessary. She's had multiple stress fractures in her feet in the past. She is under the care of Dr. Elvina Mattes podiatrist.          Past Medical History:  Diagnosis Date  . Anemia   . Breast cancer, right breast ([place])    . Initially diagnosed in 1998, stage IA, T1cN0Mx, ER positive, PR negative, HER2 unamplified of the right breast. Lumpectomy and axilla node dissection And radiation, no adjuvant treatment secondary to size of tumor. B. 07/27/11, annual mammogram: asymmetry lateral right breast. Asymmetry inferior aspect of right breast unchanged from prior studies, deep to region of scar marker. No masses, calci  . CAD (coronary artery disease)   . Coronary artery disease   . Crohn's disease ([place])   . Diabetes mellitus type 2 in nonobese (HCC)   . Esophageal stricture   . GERD (gastroesophageal reflux disease)   . Hyperlipidemia   . Hypertension   . Inflammatory bowel disease    with high-grade stenmanifested by Crohn's disease diagnosed in 1999  . Osteoarthritis   . Rheumatoid arthritis [facility])     Patient Active Problem List   Diagnosis Date Noted  . History of breast cancer in female 03/06/2016  . Iron deficiency anemia 04/05/2015    Past Surgical History:  Procedure Laterality Date  . ABDOMINAL HYSTERECTOMY    . APPENDECTOMY    . BREAST BIOPSY Right 2012   Positive  . BREAST BIOPSY Left 20+ years ago   Negative  . CAROTID ENDARTERECTOMY    . MASTECTOMY PARTIAL / LUMPECTOMY W/ AXILLARY LYMPHADENECTOMY  2013  . SIMPLE MASTECTOMY    . TONSILLECTOMY AND ADENOIDECTOMY    . TOTAL KNEE ARTHROPLASTY    . VASCULAR SURGERY      OB History    No data available       Home Medications    Prior to Admission medications   Medication Sig Start Date End Date Taking? Authorizing Provider  aspirin 81 MG tablet Take 81 mg by mouth.    [provider]  atenolol (TENORMIN) 25 MG tablet TAKE ONE TABLET BY MOUTH TWICE DAILY. 03/18/15   [provider]  Cholecalciferol (VITAMIN D) 2000 UNITS tablet Take 2,000 Units by mouth. 04/11/12   [provider]  ezetimibe-simvastatin (VYTORIN) 10-20 MG per tablet Take 1 tablet by mouth. 11/29/14   [provider]  folic acid (FOLVITE) 1 MG tablet TAKE (1) TABLET BY MOUTH EVERY DAY 02/04/15   [provider]  glipiZIDE (GLUCOTROL XL) 10 MG 24 hr tablet Take by mouth. 01/21/16   [provider]  hydrochlorothiazide (HYDRODIURIL) 12.5 MG tablet Take 12.5 mg by mouth. 11/29/14 11/29/15  [provider]  hydrochlorothiazide (HYDRODIURIL) 12.5 MG tablet Take by mouth. 11/29/14   [provider]  irbesartan (AVAPRO) 150 MG tablet  Take 150 mg by mouth. 01/10/15 01/10/19  [provider]  Multiple Vitamins-Calcium (VIACTIV MULTI-VITAMIN) CHEW Chew by mouth.    [provider]  Omeprazole 20 MG TBEC Take by mouth.    [provider]  predniSONE (DELTASONE) 5 MG tablet Take 5 mg by mouth.    [provider]  ranitidine (ZANTAC) 300 MG tablet Take by mouth. 02/28/16 02/27/17  [provider]  sitaGLIPtin (JANUVIA) 25 MG tablet Take by mouth. 01/21/16 01/20/17  [provider]  sulfaSALAzine (AZULFIDINE) 500 MG tablet Take by mouth.    [provider]    Family History Family History  Problem Relation Age of Onset  . Uterine cancer Sister   . Breast cancer  Maternal Aunt   . Breast cancer Paternal Aunt   . Breast cancer Maternal Aunt   . Alzheimer's disease Mother   . Hypertension Mother   . Hypertension Father   . Gallbladder disease Father   . Alzheimer's disease Father   . Breast cancer Daughter     Social History Social History  Substance Use Topics  . Smoking status: Never Smoker  . Smokeless tobacco: Never Used  . Alcohol use No     Allergies   Levofloxacin; Antihistamines, diphenhydramine-type; Amlodipine; Fosinopril; Fosinopril sodium-hctz; Loratadine; Methotrexate; and Nsaids   Review of Systems Review of Systems  Constitutional: Positive for activity change. Negative for appetite change, chills, fatigue and fever.  Musculoskeletal: Positive for arthralgias and gait problem.  All other systems reviewed and are negative.    Physical Exam Triage Vital Signs ED Triage Vitals  Enc Vitals Group     BP 06/04/17 1304 (!) 173/86     Pulse Rate 06/04/17 1304 60     Resp 06/04/17 1304 16     Temp 06/04/17 1304 97.9 F (36.6 C)     Temp Source 06/04/17 1304 Oral     SpO2 06/04/17 1304 100 %     Weight 06/04/17 1302 175 lb (79.4 kg)     Height 06/04/17 1302 _0  (1.702 m)     Head Circumference --      Peak Flow --      Pain Score 06/04/17 1302 7     Pain Loc --      Pain Edu? --      Excl. in [place]? --    No data found.   Updated Vital Signs BP (!) 173/86 (BP Location: Left Arm)   Pulse 60   Temp 97.9 F (36.6 C) (Oral)   Resp 16   Ht _1  (1.702 m)   Wt 175 lb (79.4 kg)   SpO2 100%   BMI 27.41 kg/m   Visual Acuity Right Eye Distance:   Left Eye Distance:   Bilateral Distance:    Right Eye Near:   Left Eye Near:    Bilateral Near:     Physical Exam  Constitutional: She is oriented to person, place, and time. She appears well-developed and well-nourished. No distress.  HENT:  Head: Normocephalic.  Eyes: Pupils are equal, round, and reactive to light.  Neck: Normal range of motion.    Musculoskeletal: She exhibits edema and tenderness.  Examination of the left foot shows some mild swelling of the ankle which the patient states is normal. There is no ecchymosis or erythema or breaks in the skin of the foot. Maximum tenderness is localized over the base of the fifth metatarsal distally along the shaft to the juncture of the proximal and middle  thirds. He also is tender over the same level on the fourth and less so on the third metatarsals. There is no heel pain. She has no significant fore foot pain. She has fairly good range of motion of the ankle.  Neurological: She is alert and oriented to person, place, and time.  Skin: Skin is warm and dry. She is not diaphoretic.  Psychiatric: She has a normal mood and affect. Her behavior is normal. Judgment and thought content normal.  Nursing note and vitals reviewed.    UC Treatments / Results  Labs (all labs ordered are listed, but only abnormal results are displayed) Labs Reviewed - No data to display  EKG  EKG Interpretation None       Radiology Dg Foot Complete Left  Result Date: 06/04/2017 CLINICAL DATA:  Pain at base of fifth metatarsal for 1 day, pain last week at third metatarsal which has subsided, history of hairline fractures in both feet as recently as 6 months ago, history breast cancer, rheumatoid arthritis, diabetes mellitus, hypertension, Crohn's disease EXAM: LEFT FOOT - COMPLETE 3+ VIEW COMPARISON:  03/05/2017 FINDINGS: Diffuse osseous demineralization. Healing fracture at base of proximal phalanx great toe medially. Callus at healed fracture of the distal third metatarsal. Mild degenerative changes at naviculocuneiform joint. Joint spaces otherwise preserved. No definite acute fracture, dislocation, or bone destruction. IMPRESSION: Healing fractures at base of the proximal phalanx great toe. Healed fracture at third metatarsal shaft distally. Osseous demineralization without definite acute bony abnormalities.  Electronically Signed   By: Lavonia Dana M.D.   On: 06/04/2017 13:28    Procedures Procedures (including critical care time)  Medications Ordered in UC Medications - No data to display   Initial Impression / Assessment and Plan / UC Course  I have reviewed the triage vital signs and the nursing notes.  Pertinent labs & imaging results that were available during my care of the patient were reviewed by me and considered in my medical decision making (see chart for details).     Plan: 1. Test/x-ray results and diagnosis reviewed with patient 2. rx as per orders; risks, benefits, potential side effects reviewed with patient 3. Recommend supportive treatment with elevation and ice as necessary. Recommended wearing a boot orthosis to help her with ambulation. She should follow with Dr. Elvina Mattes if she continues to have discomfort. She did not wish to have any pain medications. 4. F/u prn if symptoms worsen or don't improve   Final Clinical Impressions(s) / UC Diagnoses   Final diagnoses:  Contusion of fifth toe of left foot, initial encounter    New Prescriptions Discharge Medication List as of 06/04/2017  2:05 PM       Controlled Substance Prescriptions [place] Controlled Substance Registry consulted? Not Applicable   Lorin Picket, PA-C 06/04/17 1442

## 2017-06-04 NOTE — Discharge Instructions (Signed)
Keep foot elevated and use ice for comfort. Follow-up with Dr. Elvina Mattes not improving. Recommend using a boot orthosis for comfort.

## 2017-06-04 NOTE — ED Triage Notes (Signed)
Patient c/o pain, redness and swelling in her left foot for a week.  Patient reports history of stress fractures in her left foot.

## 2018-02-14 ENCOUNTER — Other Ambulatory Visit: Payer: Self-pay | Admitting: Internal Medicine

## 2018-02-14 DIAGNOSIS — Z1231 Encounter for screening mammogram for malignant neoplasm of breast: Secondary | ICD-10-CM

## 2018-09-12 ENCOUNTER — Other Ambulatory Visit: Payer: Self-pay | Admitting: Internal Medicine

## 2018-09-12 DIAGNOSIS — N644 Mastodynia: Secondary | ICD-10-CM

## 2018-09-19 ENCOUNTER — Other Ambulatory Visit: Payer: Self-pay | Admitting: Internal Medicine

## 2018-09-19 DIAGNOSIS — N644 Mastodynia: Secondary | ICD-10-CM

## 2018-09-27 ENCOUNTER — Ambulatory Visit
Admission: RE | Admit: 2018-09-27 | Discharge: 2018-09-27 | Disposition: A | Discharge status: Home / Self Care | Payer: Medicare Other | Source: Ambulatory Visit | Attending: Internal Medicine | Admitting: Internal Medicine

## 2018-09-27 DIAGNOSIS — N644 Mastodynia: Secondary | ICD-10-CM | POA: Diagnosis present

## 2019-05-06 ENCOUNTER — Other Ambulatory Visit: Payer: Self-pay

## 2019-05-06 ENCOUNTER — Emergency Department
Admission: EM | Admit: 2019-05-06 | Discharge: 2019-05-06 | Disposition: A | Discharge status: Home / Self Care | Payer: Medicare Other | Attending: Emergency Medicine | Admitting: Emergency Medicine

## 2019-05-06 ENCOUNTER — Emergency Department: Payer: Medicare Other

## 2019-05-06 DIAGNOSIS — Z7984 Long term (current) use of oral hypoglycemic drugs: Secondary | ICD-10-CM | POA: Insufficient documentation

## 2019-05-06 DIAGNOSIS — E119 Type 2 diabetes mellitus without complications: Secondary | ICD-10-CM | POA: Insufficient documentation

## 2019-05-06 DIAGNOSIS — Z7982 Long term (current) use of aspirin: Secondary | ICD-10-CM | POA: Diagnosis not present

## 2019-05-06 DIAGNOSIS — I2510 Atherosclerotic heart disease of native coronary artery without angina pectoris: Secondary | ICD-10-CM | POA: Diagnosis not present

## 2019-05-06 DIAGNOSIS — Z79899 Other long term (current) drug therapy: Secondary | ICD-10-CM | POA: Diagnosis not present

## 2019-05-06 DIAGNOSIS — I10 Essential (primary) hypertension: Secondary | ICD-10-CM | POA: Diagnosis not present

## 2019-05-06 DIAGNOSIS — R079 Chest pain, unspecified: Secondary | ICD-10-CM | POA: Diagnosis not present

## 2019-05-06 LAB — CBC
HCT: 39.1 % (ref 36.0–46.0)
Hemoglobin: 12.8 g/dL (ref 12.0–15.0)
MCH: 29.5 pg (ref 26.0–34.0)
MCHC: 32.7 g/dL (ref 30.0–36.0)
MCV: 90.1 fL (ref 80.0–100.0)
Platelets: 207 10*3/uL (ref 150–400)
RBC: 4.34 MIL/uL (ref 3.87–5.11)
RDW: 13.2 % (ref 11.5–15.5)
WBC: 9.9 10*3/uL (ref 4.0–10.5)
nRBC: 0 % (ref 0.0–0.2)

## 2019-05-06 LAB — COMPREHENSIVE METABOLIC PANEL
ALT: 22 U/L (ref 0–44)
AST: 21 U/L (ref 15–41)
Albumin: 4.2 g/dL (ref 3.5–5.0)
Alkaline Phosphatase: 62 U/L (ref 38–126)
Anion gap: 11 (ref 5–15)
BUN: 41 mg/dL — ABNORMAL HIGH (ref 8–23)
CO2: 25 mmol/L (ref 22–32)
Calcium: 9.5 mg/dL (ref 8.9–10.3)
Chloride: 98 mmol/L (ref 98–111)
Creatinine, Ser: 1.48 mg/dL — ABNORMAL HIGH (ref 0.44–1.00)
GFR calc Af Amer: 38 mL/min — ABNORMAL LOW (ref >60)
GFR calc non Af Amer: 33 mL/min — ABNORMAL LOW (ref >60)
Glucose, Bld: 245 mg/dL — ABNORMAL HIGH (ref 70–99)
Potassium: 4.2 mmol/L (ref 3.5–5.1)
Sodium: 134 mmol/L — ABNORMAL LOW (ref 135–145)
Total Bilirubin: 0.8 mg/dL (ref 0.3–1.2)
Total Protein: 7 g/dL (ref 6.5–8.1)

## 2019-05-06 LAB — TROPONIN I (HIGH SENSITIVITY)
Troponin I (High Sensitivity): 6 ng/L (ref <18)
Troponin I (High Sensitivity): 9 ng/L (ref <18)

## 2019-05-06 MED ORDER — SODIUM CHLORIDE 0.9% FLUSH: 3.0000 mL | Freq: Once | INTRAVENOUS | Status: DC

## 2019-05-06 NOTE — ED Triage Notes (Signed)
Pt stated that she was leaving the grocery store in her car around 1330 when she began to have upper epigastric pain. Pt stated that once EMS arrived she took 324 mg of aspirin.

## 2019-05-06 NOTE — ED Provider Notes (Signed)
[facility] Emergency Department Provider Note  Time seen: 3:48 PM  I have reviewed the triage vital signs and the nursing notes.   HISTORY  Chief Complaint Chest Pain   HPI Rebecca Barajas is a 85 y.o. female with a past medical history of anemia, CAD status post 1 stent, gastric reflux, hypertension, hyperlipidemia presents to the emergency department for chest pain.  According to the patient she was leaving the grocery store around 1:30 PM when she developed a tightness sensation to the center of her chest.  Patient states she sat down in her car and rested the pain appeared to dissipate so she went home.  States shortly after getting home she developed a chest tightness once again.  States it lasted longer this time she cannot tell me exactly how long it lasted but she called EMS.  States by the time EMS got there the chest pain had resolved.  Denies any shortness of breath or diaphoresis or nausea at any point.  Denies any chest pain currently.  Mild lower extremity swelling which is chronic for the patient denies any new or worsening swelling.  Denies any leg pain.  No fever cough congestion or shortness of breath.   Past Medical History:  Diagnosis Date  . Anemia   . Breast cancer, right breast ([place])    . Initially diagnosed in 1998, stage IA, T1cN0Mx, ER positive, PR negative, HER2 unamplified of the right breast. Lumpectomy and axilla node dissection And radiation, no adjuvant treatment secondary to size of tumor. B. 07/27/11, annual mammogram: asymmetry lateral right breast. Asymmetry inferior aspect of right breast unchanged from prior studies, deep to region of scar marker. No masses, calci  . CAD (coronary artery disease)   . Coronary artery disease   . Crohn's disease ([place])   . Diabetes mellitus type 2 in nonobese (HCC)   . Esophageal stricture   . GERD (gastroesophageal reflux disease)   . Hyperlipidemia   . Hypertension   . Inflammatory bowel disease     with high-grade stenmanifested by Crohn's disease diagnosed in 1999  . Osteoarthritis   . Rheumatoid arthritis [facility])     Patient Active Problem List   Diagnosis Date Noted  . History of breast cancer in female 03/06/2016  . Iron deficiency anemia 04/05/2015    Past Surgical History:  Procedure Laterality Date  . ABDOMINAL HYSTERECTOMY    . APPENDECTOMY    . BREAST BIOPSY Right 2012   Positive  . BREAST EXCISIONAL BIOPSY Left 1980's  . CAROTID ENDARTERECTOMY    . MASTECTOMY PARTIAL / LUMPECTOMY W/ AXILLARY LYMPHADENECTOMY  2013  . SIMPLE MASTECTOMY    . TONSILLECTOMY AND ADENOIDECTOMY    . TOTAL KNEE ARTHROPLASTY    . VASCULAR SURGERY      Prior to Admission medications   Medication Sig Start Date End Date Taking? Authorizing Provider  aspirin 81 MG tablet Take 81 mg by mouth.    [provider]  atenolol (TENORMIN) 25 MG tablet TAKE ONE TABLET BY MOUTH TWICE DAILY. 03/18/15   [provider]  Cholecalciferol (VITAMIN D) 2000 UNITS tablet Take 2,000 Units by mouth. 04/11/12   [provider]  ezetimibe-simvastatin (VYTORIN) 10-20 MG per tablet Take 1 tablet by mouth. 11/29/14   [provider]  folic acid (FOLVITE) 1 MG tablet TAKE (1) TABLET BY MOUTH EVERY DAY 02/04/15   [provider]  glipiZIDE (GLUCOTROL XL) 10 MG 24 hr tablet Take by mouth. 01/21/16  [provider]  hydrochlorothiazide (HYDRODIURIL) 12.5 MG tablet Take 12.5 mg by mouth. 11/29/14 11/29/15  [provider]  hydrochlorothiazide (HYDRODIURIL) 12.5 MG tablet Take by mouth. 11/29/14   [provider]  irbesartan (AVAPRO) 150 MG tablet Take 150 mg by mouth. 01/10/15 01/10/19  [provider]  Multiple Vitamins-Calcium (VIACTIV MULTI-VITAMIN) CHEW Chew by mouth.    [provider]  Omeprazole 20 MG TBEC Take by mouth.    [provider]  predniSONE (DELTASONE) 5 MG tablet Take 5 mg by mouth.    [provider]   ranitidine (ZANTAC) 300 MG tablet Take by mouth. 02/28/16 02/27/17  [provider]  sitaGLIPtin (JANUVIA) 25 MG tablet Take by mouth. 01/21/16 01/20/17  [provider]  sulfaSALAzine (AZULFIDINE) 500 MG tablet Take by mouth.    [provider]    Allergies  Allergen Reactions  . Levofloxacin Other (See Comments) and Diarrhea    Diarrhea, nausea, rectal bleeding, mouth sores DIARRHEA WITH RECTAL BLEEDING Other reaction(s): Bleeding Diarrhea, nausea, rectal bleeding, mouth sores  . Antihistamines, Diphenhydramine-Type Other (See Comments)    hypertension  . Amlodipine Itching    Intolerant Intolerant  . Fosinopril Itching    Intolerant  . Fosinopril Sodium-Hctz     Intolerant  . Loratadine Other (See Comments)    all antihistamines  . Methotrexate Diarrhea  . Nsaids Diarrhea    Diarrhea/abdominal cramping Diarrhea/abdominal cramping    Family History  Problem Relation Age of Onset  . Uterine cancer Sister   . Breast cancer Maternal Aunt 55  . Breast cancer Paternal Aunt   . Alzheimer's disease Mother   . Hypertension Mother   . Hypertension Father   . Gallbladder disease Father   . Alzheimer's disease Father   . Breast cancer Daughter 17    Social History Social History   Tobacco Use  . Smoking status: Never Smoker  . Smokeless tobacco: Never Used  Substance Use Topics  . Alcohol use: No  . Drug use: No    Review of Systems Constitutional: Negative for fever. ENT: Negative for recent illness/congestion Cardiovascular: Central chest pain/tightness, mild to moderate in severity now resolved. Respiratory: Negative for shortness of breath.  Negative for cough. Gastrointestinal: Negative for abdominal pain, vomiting  Musculoskeletal: Negative for leg pain or increased swelling Neurological: Negative for headache All other ROS negative  ____________________________________________   PHYSICAL EXAM:  Constitutional: Alert and  oriented. Well appearing and in no distress. Eyes: Normal exam ENT      Head: Normocephalic and atraumatic      Mouth/Throat: Mucous membranes are moist. Cardiovascular: Normal rate, regular rhythm. Respiratory: Normal respiratory effort without tachypnea nor retractions. Breath sounds are clear  Gastrointestinal: Soft and nontender. No distention.   Musculoskeletal: Nontender with normal range of motion in all extremities. No lower extremity tenderness, slight edema bilaterally which patient states is chronic. Neurologic:  Normal speech and language. No gross focal neurologic deficits Skin:  Skin is warm, dry and intact.  Psychiatric: Mood and affect are normal.  ____________________________________________    EKG  EKG viewed and interpreted by myself shows a normal sinus rhythm at 62 bpm with a narrow QRS, normal axis, normal intervals, no concerning ST changes.  Reassuring EKG.  ____________________________________________    RADIOLOGY  Chest x-ray negative  ____________________________________________   INITIAL IMPRESSION / ASSESSMENT AND PLAN / ED COURSE  Pertinent labs & imaging results that were available during my care of the patient were reviewed by me and considered  in my medical decision making (see chart for details).   Patient presents to the emergency department for central chest pain/tightness starting around 1:30 PM, has since completely resolved.  Patient received 324 of aspirin by EMS, no nitroglycerin as the pain had already resolved.  Differential this time would include ACS, chest wall pain, angina, reflux or gastric discomfort.  We will check labs including cardiac enzymes.  Patient is EKG is reassuring we will obtain a chest x-ray as well.  X-ray negative.  Labs are reassuring including cardiac enzymes x2.  Patient continues to appear very well.  We will discharge from the emergency department with PCP follow-up.  Patient agreeable to plan of care.  KIERSTYNN BABICH was evaluated in Emergency Department on 05/06/2019 for the symptoms described in the history of present illness. She was evaluated in the context of the global COVID-19 pandemic, which necessitated consideration that the patient might be at risk for infection with the SARS-CoV-2 virus that causes COVID-19. Institutional protocols and algorithms that pertain to the evaluation of patients at risk for COVID-19 are in a state of rapid change based on information released by regulatory bodies including the CDC and federal and state organizations. These policies and algorithms were followed during the patient's care in the ED.  ____________________________________________   FINAL CLINICAL IMPRESSION(S) / ED DIAGNOSES  Chest pain   Harvest Dark, MD 05/06/19 1947

## 2020-08-19 ENCOUNTER — Emergency Department: Payer: Medicare Other

## 2020-08-19 ENCOUNTER — Observation Stay: Admit: 2020-08-19 | Payer: Medicare Other

## 2020-08-19 ENCOUNTER — Encounter: Payer: Self-pay | Admitting: Emergency Medicine

## 2020-08-19 ENCOUNTER — Other Ambulatory Visit: Payer: Self-pay

## 2020-08-19 ENCOUNTER — Observation Stay
Admission: EM | Admit: 2020-08-19 | Discharge: 2020-08-20 | Disposition: A | Discharge status: Home / Self Care | Payer: Medicare Other | Attending: Hospitalist | Admitting: Hospitalist

## 2020-08-19 DIAGNOSIS — Z7984 Long term (current) use of oral hypoglycemic drugs: Secondary | ICD-10-CM | POA: Diagnosis not present

## 2020-08-19 DIAGNOSIS — I2510 Atherosclerotic heart disease of native coronary artery without angina pectoris: Secondary | ICD-10-CM | POA: Diagnosis not present

## 2020-08-19 DIAGNOSIS — I10 Essential (primary) hypertension: Secondary | ICD-10-CM | POA: Diagnosis not present

## 2020-08-19 DIAGNOSIS — R202 Paresthesia of skin: Secondary | ICD-10-CM | POA: Diagnosis present

## 2020-08-19 DIAGNOSIS — E785 Hyperlipidemia, unspecified: Secondary | ICD-10-CM | POA: Diagnosis not present

## 2020-08-19 DIAGNOSIS — I639 Cerebral infarction, unspecified: Secondary | ICD-10-CM | POA: Diagnosis not present

## 2020-08-19 DIAGNOSIS — Z7982 Long term (current) use of aspirin: Secondary | ICD-10-CM | POA: Insufficient documentation

## 2020-08-19 DIAGNOSIS — R634 Abnormal weight loss: Secondary | ICD-10-CM | POA: Diagnosis not present

## 2020-08-19 DIAGNOSIS — R200 Anesthesia of skin: Secondary | ICD-10-CM | POA: Diagnosis present

## 2020-08-19 DIAGNOSIS — Z20822 Contact with and (suspected) exposure to covid-19: Secondary | ICD-10-CM | POA: Diagnosis not present

## 2020-08-19 DIAGNOSIS — G9389 Other specified disorders of brain: Secondary | ICD-10-CM | POA: Diagnosis not present

## 2020-08-19 DIAGNOSIS — Z853 Personal history of malignant neoplasm of breast: Secondary | ICD-10-CM

## 2020-08-19 DIAGNOSIS — E119 Type 2 diabetes mellitus without complications: Secondary | ICD-10-CM | POA: Insufficient documentation

## 2020-08-19 DIAGNOSIS — Z79899 Other long term (current) drug therapy: Secondary | ICD-10-CM | POA: Insufficient documentation

## 2020-08-19 LAB — COMPREHENSIVE METABOLIC PANEL
ALT: 19 U/L (ref 0–44)
AST: 22 U/L (ref 15–41)
Albumin: 3.7 g/dL (ref 3.5–5.0)
Alkaline Phosphatase: 53 U/L (ref 38–126)
Anion gap: 12 (ref 5–15)
BUN: 37 mg/dL — ABNORMAL HIGH (ref 8–23)
CO2: 25 mmol/L (ref 22–32)
Calcium: 9.7 mg/dL (ref 8.9–10.3)
Chloride: 101 mmol/L (ref 98–111)
Creatinine, Ser: 1.47 mg/dL — ABNORMAL HIGH (ref 0.44–1.00)
GFR, Estimated: 35 mL/min — ABNORMAL LOW (ref >60)
Glucose, Bld: 169 mg/dL — ABNORMAL HIGH (ref 70–99)
Potassium: 4.4 mmol/L (ref 3.5–5.1)
Sodium: 138 mmol/L (ref 135–145)
Total Bilirubin: 0.7 mg/dL (ref 0.3–1.2)
Total Protein: 6.8 g/dL (ref 6.5–8.1)

## 2020-08-19 LAB — CBC
HCT: 36.4 % (ref 36.0–46.0)
Hemoglobin: 11.8 g/dL — ABNORMAL LOW (ref 12.0–15.0)
MCH: 29 pg (ref 26.0–34.0)
MCHC: 32.4 g/dL (ref 30.0–36.0)
MCV: 89.4 fL (ref 80.0–100.0)
Platelets: 192 10*3/uL (ref 150–400)
RBC: 4.07 MIL/uL (ref 3.87–5.11)
RDW: 13.8 % (ref 11.5–15.5)
WBC: 5.7 10*3/uL (ref 4.0–10.5)
nRBC: 0 % (ref 0.0–0.2)

## 2020-08-19 LAB — DIFFERENTIAL
Abs Immature Granulocytes: 0.02 10*3/uL (ref 0.00–0.07)
Basophils Absolute: 0.1 10*3/uL (ref 0.0–0.1)
Basophils Relative: 1 %
Eosinophils Absolute: 0.3 10*3/uL (ref 0.0–0.5)
Eosinophils Relative: 5 %
Immature Granulocytes: 0 %
Lymphocytes Relative: 29 %
Lymphs Abs: 1.6 10*3/uL (ref 0.7–4.0)
Monocytes Absolute: 0.6 10*3/uL (ref 0.1–1.0)
Monocytes Relative: 11 %
Neutro Abs: 3.1 10*3/uL (ref 1.7–7.7)
Neutrophils Relative %: 54 %

## 2020-08-19 LAB — GLUCOSE, CAPILLARY: Glucose-Capillary: 172 mg/dL — ABNORMAL HIGH (ref 70–99)

## 2020-08-19 LAB — RESP PANEL BY RT-PCR (FLU A&B, COVID) ARPGX2
Influenza A by PCR: NEGATIVE
Influenza B by PCR: NEGATIVE
SARS Coronavirus 2 by RT PCR: NEGATIVE

## 2020-08-19 MED ORDER — INSULIN ASPART 100 UNIT/ML ~~LOC~~ SOLN: 0.0000 [IU] | Freq: Every day | SUBCUTANEOUS | Status: DC

## 2020-08-19 MED ORDER — SODIUM CHLORIDE 0.9% FLUSH
3.0000 mL | Freq: Once | INTRAVENOUS | Status: AC
  Administered 2020-08-19: 3 mL via INTRAVENOUS

## 2020-08-19 MED ORDER — SIMVASTATIN 20 MG PO TABS
20.0000 mg | ORAL_TABLET | Freq: Every day | ORAL | Status: DC
  Administered 2020-08-19: 20 mg via ORAL
  Filled 2020-08-19: qty 1

## 2020-08-19 MED ORDER — ACETAMINOPHEN 650 MG RE SUPP: 650.0000 mg | RECTAL | Status: DC | PRN

## 2020-08-19 MED ORDER — SITAGLIPTIN PHOSPHATE 25 MG PO TABS
50.0000 mg | ORAL_TABLET | Freq: Every day | ORAL | Status: DC
  Administered 2020-08-20: 50 mg via ORAL
  Filled 2020-08-19: qty 2

## 2020-08-19 MED ORDER — ACETAMINOPHEN 325 MG PO TABS: 650.0000 mg | ORAL_TABLET | ORAL | Status: DC | PRN

## 2020-08-19 MED ORDER — EZETIMIBE 10 MG PO TABS
10.0000 mg | ORAL_TABLET | Freq: Every day | ORAL | Status: DC
  Filled 2020-08-19 (×2): qty 1

## 2020-08-19 MED ORDER — SITAGLIPTIN PHOSPHATE 25 MG PO TABS
50.0000 mg | ORAL_TABLET | Freq: Every day | ORAL | Status: DC
  Filled 2020-08-19 (×2): qty 2

## 2020-08-19 MED ORDER — INSULIN ASPART 100 UNIT/ML ~~LOC~~ SOLN
0.0000 [IU] | Freq: Three times a day (TID) | SUBCUTANEOUS | Status: DC
  Administered 2020-08-19: 2 [IU] via SUBCUTANEOUS
  Filled 2020-08-19: qty 1

## 2020-08-19 MED ORDER — NON FORMULARY: 50.0000 mg | Freq: Every day | Status: DC

## 2020-08-19 MED ORDER — NIFEDIPINE 10 MG PO CAPS
10.0000 mg | ORAL_CAPSULE | ORAL | Status: DC | PRN
  Filled 2020-08-19: qty 1

## 2020-08-19 MED ORDER — SODIUM CHLORIDE 0.9 % IV SOLN: INTRAVENOUS | Status: DC

## 2020-08-19 MED ORDER — EZETIMIBE-SIMVASTATIN 10-20 MG PO TABS: 1.0000 | ORAL_TABLET | Freq: Every day | ORAL | Status: DC

## 2020-08-19 MED ORDER — ASPIRIN 81 MG PO CHEW
324.0000 mg | CHEWABLE_TABLET | Freq: Once | ORAL | Status: AC
  Administered 2020-08-19: 81 mg via ORAL
  Filled 2020-08-19: qty 4

## 2020-08-19 MED ORDER — ACETAMINOPHEN 160 MG/5ML PO SOLN
650.0000 mg | ORAL | Status: DC | PRN
  Filled 2020-08-19: qty 20.3

## 2020-08-19 MED ORDER — STROKE: EARLY STAGES OF RECOVERY BOOK: Freq: Once | Status: DC

## 2020-08-19 NOTE — ED Notes (Signed)
Patient transported to MRI 

## 2020-08-19 NOTE — ED Provider Notes (Signed)
[facility] Emergency Department Provider Note   ____________________________________________   First MD Initiated Contact with Patient 08/19/20 1010     (approximate)  I have reviewed the triage vital signs and the nursing notes.   HISTORY  Chief Complaint Numbness    HPI TECORA Barajas is a 86 y.o. female who reports right-sided face numbness.  She had some face numbness yesterday and had difficulty putting on her lipstick.  This morning she woke up and the entire right side of her face was numb.  There is no numbness elsewhere in her arms legs chest etc.  She reports the right eye is not closing well and she has a little bit of right-sided facial droop.  She is taking her medicines today including her blood pressure medicine her blood pressure still high today.  She took them just before she left home.  She has no headache or other symptoms.        Past Medical History:  Diagnosis Date  . Anemia   . Breast cancer, right breast ([place])    . Initially diagnosed in 1998, stage IA, T1cN0Mx, ER positive, PR negative, HER2 unamplified of the right breast. Lumpectomy and axilla node dissection And radiation, no adjuvant treatment secondary to size of tumor. B. 07/27/11, annual mammogram: asymmetry lateral right breast. Asymmetry inferior aspect of right breast unchanged from prior studies, deep to region of scar marker. No masses, calci  . CAD (coronary artery disease)   . Coronary artery disease   . Crohn's disease ([place])   . Diabetes mellitus type 2 in nonobese (HCC)   . Esophageal stricture   . GERD (gastroesophageal reflux disease)   . Hyperlipidemia   . Hypertension   . Inflammatory bowel disease    with high-grade stenmanifested by Crohn's disease diagnosed in 1999  . Osteoarthritis   . Rheumatoid arthritis [facility])     Patient Active Problem List   Diagnosis Date Noted  . CVA (cerebral vascular accident) ([place]) 08/19/2020  . Essential hypertension  08/19/2020  . Hyperlipidemia 08/19/2020  . Brain mass 08/19/2020  . Numbness and tingling of right face 08/19/2020  . Weight loss 08/19/2020  . History of breast cancer in female 03/06/2016  . Iron deficiency anemia 04/05/2015    Past Surgical History:  Procedure Laterality Date  . ABDOMINAL HYSTERECTOMY    . APPENDECTOMY    . BREAST BIOPSY Right 2012   Positive  . BREAST EXCISIONAL BIOPSY Left 1980's  . CAROTID ENDARTERECTOMY    . MASTECTOMY PARTIAL / LUMPECTOMY W/ AXILLARY LYMPHADENECTOMY  2013  . SIMPLE MASTECTOMY    . TONSILLECTOMY AND ADENOIDECTOMY    . TOTAL KNEE ARTHROPLASTY    . VASCULAR SURGERY      Prior to Admission medications   Medication Sig Start Date End Date Taking? Authorizing Provider  acidophilus (RISAQUAD) CAPS capsule Take 1 capsule by mouth daily.   Yes [provider]  aspirin 81 MG tablet Take 81 mg by mouth.   Yes [provider]  atenolol (TENORMIN) 25 MG tablet Take 25 mg by mouth 2 (two) times daily.    Yes [provider]  Cholecalciferol (VITAMIN D) 2000 UNITS tablet Take 2,000 Units by mouth. 04/11/12  Yes [provider]  ferrous sulfate 325 (65 FE) MG tablet Take 325 mg by mouth daily.   Yes [provider]  folic acid (FOLVITE) 1 MG tablet Take 1 mg by mouth daily.    Yes [provider]  glipiZIDE (  GLUCOTROL XL) 10 MG 24 hr tablet Take 10 mg by mouth daily.    Yes [provider]  hydrochlorothiazide (MICROZIDE) 12.5 MG capsule Take 12.5 mg by mouth daily.    Yes [provider]  irbesartan (AVAPRO) 150 MG tablet Take 150 mg by mouth.   Yes [provider]  Multiple Vitamins-Calcium (VIACTIV MULTI-VITAMIN) CHEW Chew by mouth.   Yes [provider]  Omega-3 1000 MG CAPS Take 1,000 mg by mouth 2 (two) times daily.   Yes [provider]  rosuvastatin (CRESTOR) 40 MG tablet Take 40 mg by mouth daily.   Yes [provider]  sitaGLIPtin  (JANUVIA) 50 MG tablet Take 50 mg by mouth daily.    Yes [provider]    Allergies Levofloxacin; Antihistamines, diphenhydramine-type; Amlodipine; Fosinopril; Fosinopril sodium-hctz; Loratadine; Methotrexate; and Nsaids  Family History  Problem Relation Age of Onset  . Uterine cancer Sister   . Breast cancer Maternal Aunt 61  . Breast cancer Paternal Aunt   . Alzheimer's disease Mother   . Hypertension Mother   . Hypertension Father   . Gallbladder disease Father   . Alzheimer's disease Father   . Breast cancer Daughter 107    Social History Social History   Tobacco Use  . Smoking status: Never Smoker  . Smokeless tobacco: Never Used  Vaping Use  . Vaping Use: Never used  Substance Use Topics  . Alcohol use: No    Comment: occasional  . Drug use: No    Review of Systems  Constitutional: No fever/chills Eyes: No visual changes. ENT: No sore throat. Cardiovascular: Denies chest pain. Respiratory: Denies shortness of breath. Gastrointestinal: No abdominal pain.  No nausea, no vomiting.  No diarrhea.  No constipation. Genitourinary: Negative for dysuria. Musculoskeletal: Negative for back pain. Skin: Negative for rash. Neurological: See HPI above  ____________________________________________   PHYSICAL EXAM:  VITAL SIGNS: ED Triage Vitals  Enc Vitals Group     BP 08/19/20 0934 (!) 184/90     Pulse Rate 08/19/20 0934 (!) 59     Resp 08/19/20 0934 20     Temp 08/19/20 0934 98.3 F (36.8 C)     Temp Source 08/19/20 0934 Oral     SpO2 08/19/20 0934 99 %     Weight 08/19/20 0935 160 lb (72.6 kg)     Height 08/19/20 0935 _0  (1.702 m)     Head Circumference --      Peak Flow --      Pain Score 08/19/20 0934 0     Pain Loc --      Pain Edu? --      Excl. in [place]? --     Constitutional: Alert and oriented. Well appearing and in no acute distress. Eyes: Conjunctivae are normal. PERRL. EOMI. Head: Atraumatic.   Nose: No  congestion/rhinnorhea. Mouth/Throat: Mucous membranes are moist.  Oropharynx non-erythematous. Neck: No stridor. Cardiovascular: Normal rate, regular rhythm. Grossly normal heart sounds.  Good peripheral circulation. Respiratory: Normal respiratory effort.  No retractions. Lungs CTAB. Gastrointestinal: Soft and nontender. No distention. No abdominal bruits. No CVA tenderness. Musculoskeletal: No lower extremity tenderness nor edema.  Neurologic:  Normal speech and language.  Forehead appears to wrinkle equally bilaterally but the rest of the right side of the face there is some right-sided facial droop in the right eyes not closing well.  Patient reports numbness in forehead cheek and jaw on the right side.  Other cranial nerves are intact.  Cerebellar finger-nose  and rapid alternating movements and hands are within normal limits accounting for her arthritis.  There is no numbness elsewhere in the body and motor strength is 5/5 throughout Skin:  Skin is warm, dry and intact. Psychiatric: Mood and affect are normal. Speech and behavior are normal.  ____________________________________________   LABS (all labs ordered are listed, but only abnormal results are displayed)  Labs Reviewed  CBC - Abnormal; Notable for the following components:      Result Value   Hemoglobin 11.8 (*)    All other components within normal limits  COMPREHENSIVE METABOLIC PANEL - Abnormal; Notable for the following components:   Glucose, Bld 169 (*)    BUN 37 (*)    Creatinine, Ser 1.47 (*)    GFR, Estimated 35 (*)    All other components within normal limits  RESP PANEL BY RT-PCR (FLU A&B, COVID) ARPGX2  DIFFERENTIAL  PROTIME-INR  APTT  I-STAT CREATININE, ED  CBG MONITORING, ED   ____________________________________________  EKG  EKG read interpreted by me shows sinus bradycardia rate of 57 left axis no acute ST-T changes ____________________________________________  RADIOLOGY Gertha Calkin,  personally viewed and evaluated these images (plain radiographs) as part of my medical decision making, as well as reviewing the written report by the radiologist.  ED MD interpretation: CT of the head read by radiology reviewed by me shows no acute changes  Official radiology report(s): CT HEAD WO CONTRAST  Result Date: 08/19/2020 CLINICAL DATA:  Neuro deficit, acute, stroke suspected EXAM: CT HEAD WITHOUT CONTRAST TECHNIQUE: Contiguous axial images were obtained from the base of the skull through the vertex without intravenous contrast. COMPARISON:  10/07/2012 head CT and prior. FINDINGS: Brain: No acute infarct or intracranial hemorrhage. No mass lesion. No midline shift, ventriculomegaly or extra-axial fluid collection. Vascular: No hyperdense vessel or unexpected calcification. Bilateral skull base atherosclerotic calcifications. Skull: Negative for fracture or focal lesion. Sinuses/Orbits: Normal orbits. Clear paranasal sinuses. No mastoid effusion. Other: None. IMPRESSION: No acute intracranial process. Electronically Signed   By: Primitivo Gauze M.D.   On: 08/19/2020 10:04   MR BRAIN WO CONTRAST  Result Date: 08/19/2020 CLINICAL DATA:  Neuro deficit, acute, stroke suspected. Additional provided: Right-sided facial numbness since awakening this morning. EXAM: MRI HEAD WITHOUT CONTRAST TECHNIQUE: Multiplanar, multiecho pulse sequences of the brain and surrounding structures were obtained without intravenous contrast. COMPARISON:  Head CT performed earlier the same day 08/19/2020. Brain MRI 11/20/2005. FINDINGS: Brain: Mild generalized cerebral atrophy. There is a 2 mm focus of restricted diffusion within the posterior left frontal lobe (motor strip), consistent with acute infarction (series 5, image 40). Mild multifocal T2/FLAIR hyperintensity within the cerebral white matter is nonspecific, but compatible with chronic small vessel ischemic disease. These findings have progressed as compared  to the brain MRI of 11/20/2005. Apparent 6 mm mass along the left tentorium demonstrating homogeneous T2 intermediate signal (series 10, image 7) (series 17, image 11). This is a new finding as compared to the prior MRI and likely reflects an incidental small meningioma. Chronic microhemorrhage within the right basal ganglia. No extra-axial fluid collection. No midline shift. Vascular: Expected proximal arterial flow voids. Skull and upper cervical spine: No focal marrow lesion. Sinuses/Orbits: Visualized orbits show no acute finding. Mild ethmoid and maxillary sinus mucosal thickening. Other: Small bilateral mastoid effusions IMPRESSION: 2 mm acute infarct within the left frontal lobe motor strip. Suspected 6 mm meningioma along the left tentorium, a new finding as compared to the brain MRI of  11/20/2005. Mild cerebral atrophy and chronic small vessel ischemic disease, progressed. Mild bilateral ethmoid and maxillary sinus mucosal thickening. Small bilateral mastoid effusions. Electronically Signed   By: Kellie Simmering DO   On: 08/19/2020 11:29    ____________________________________________   PROCEDURES  Procedure(s) performed (including Critical Care):  Procedures   ____________________________________________   INITIAL IMPRESSION / ASSESSMENT AND PLAN / ED COURSE  Patient with facial numbness and symmetrical forehead rise but no other findings.  I am uncertain if this is a Bell's palsy or a very focal stroke.  I will get an MRI to see if that will help me determine things.    MRI returned showing a small stroke.  Dr. Elisabeth Cara sees patient agrees that this looks most like a Bell's palsy but patient has risk factors for stroke and in fact has an MRI that shows an acute stroke.  We will have to admit her.  She took 3 baby aspirin's earlier today we will give her 1 more.          ____________________________________________   FINAL CLINICAL IMPRESSION(S) / ED DIAGNOSES  Final diagnoses:   Cerebrovascular accident (CVA), unspecified mechanism [facility])     ED Discharge Orders    None      *Please note:  LOREDANA MEDELLIN was evaluated in Emergency Department on 08/19/2020 for the symptoms described in the history of present illness. She was evaluated in the context of the global COVID-19 pandemic, which necessitated consideration that the patient might be at risk for infection with the SARS-CoV-2 virus that causes COVID-19. Institutional protocols and algorithms that pertain to the evaluation of patients at risk for COVID-19 are in a state of rapid change based on information released by regulatory bodies including the CDC and federal and state organizations. These policies and algorithms were followed during the patient's care in the ED.  Some ED evaluations and interventions may be delayed as a result of limited staffing during and the pandemic.*   Note:  This document was prepared using Dragon voice recognition software and may include unintentional dictation errors.    Nena Polio, MD 08/19/20 [phone]

## 2020-08-19 NOTE — Plan of Care (Signed)

## 2020-08-19 NOTE — Evaluation (Signed)
Physical Therapy Evaluation Patient Details Name: Rebecca Barajas MRN: [id number] DOB: April 09, 1934 Today's Date: 08/19/2020   History of Present Illness  Pt is an 86 y.o. female with medical history significant for hypertension, coronary artery disease of the LAD, DM, chrohns in remission, RA and osteoarthritis, right breast intraductal (?) cancer s/p radiation in 1999 and recurrence then undergoing complete mastectomy in January 2013, presents to the emergency department for chief concerns of right-sided numbness.  Of note patient also reports recent L wrist fracture that she is still NWB for.  Per neurology notes pt diagnosed with Bell's Palsy with MRI also showing small L frontal stroke in setting of small vessel disease.    Clinical Impression  Pt was pleasant and motivated to participate during the session.  Pt reported no noted physical deficits other than symptoms of Bell's Palsy.  Pt's tested UE and LE strength was [facility] and equal L vs R.  Peripheral vision WNL and equal L vs R with good attention to obstacles and surrounding during ambulation.  Pt's sensation to light touch and proprioception grossly intact to BLEs and BUEs.  Pt's tested static and dynamic standing balance demonstrated no significant deficits and were at baseline per patient.  No skilled PT needs identified at this time.  Will complete PT orders at this time but will reassess pt pending a change in status upon receipt of new PT orders.      Follow Up Recommendations No PT follow up;Supervision - Intermittent    Equipment Recommendations  None recommended by PT    Recommendations for Other Services       Precautions / Restrictions Precautions Precautions: Fall Restrictions Weight Bearing Restrictions: Yes LUE Weight Bearing: Non weight bearing Other Position/Activity Restrictions: NWB through L wrist per patient report secondary to recent L wrist Fx      Mobility  Bed Mobility Overal bed mobility: Independent                   Transfers Overall transfer level: Independent Equipment used: None                Ambulation/Gait Ambulation/Gait assistance: Independent Gait Distance (Feet): 150 Feet Assistive device: None Gait Pattern/deviations: Step-through pattern;Decreased step length - right;Decreased step length - left Gait velocity: decreased   General Gait Details: Pt ambulated with a slow cadence at first secondary to "not having my shoes, just being cautious" but cadence increased as ambulation distance progressed; steady without LOB with good awareness of obstacles/surroundings  Stairs            Wheelchair Mobility    Modified Rankin (Stroke Patients Only)       Balance Overall balance assessment: Independent                                           Pertinent Vitals/Pain Pain Assessment: No/denies pain    Home Living Family/patient expects to be discharged to:: Private residence Living Arrangements: Children Available Help at Discharge: Family;Available 24 hours/day Type of Home: House Home Access: Ramped entrance;Stairs to enter Entrance Stairs-Rails: None Entrance Stairs-Number of Steps: 1 Home Layout: Two level;Able to live on main level with bedroom/bathroom Home Equipment: Shower seat - built in;Grab bars - tub/shower Additional Comments: Pt unable to use AD's secondary to RA/pain in her hands    Prior Function Level of Independence: Independent  Comments: Ind amb community distances without an AD, one fall in the last year secondary to tripping in a gopher hole, Ind with ADLs, son assists with some housework     Hand Dominance        Extremity/Trunk Assessment   Upper Extremity Assessment Upper Extremity Assessment: RUE deficits/detail;LUE deficits/detail RUE Deficits / Details: RUE elbow flex/ext strength WFL and equal L vs R; hand/wrist strength NT secondary to RA pain RUE Sensation: WNL RUE  Coordination: WNL LUE Deficits / Details: LUE elbow flex/ext strength WFL and equal L vs R; hand/wrist strength NT secondary to RA pain and wrist fracture LUE Sensation: WNL LUE Coordination: WNL    Lower Extremity Assessment Lower Extremity Assessment: LLE deficits/detail;RLE deficits/detail RLE Deficits / Details: RLE strength and sensation WNL and equal L vs R RLE Sensation: WNL RLE Coordination: WNL LLE Deficits / Details: LLE strength and sensation WNL and equal L vs R LLE Sensation: WNL LLE Coordination: WNL       Communication   Communication: No difficulties  Cognition Arousal/Alertness: Awake/alert Behavior During Therapy: WFL for tasks assessed/performed Overall Cognitive Status: Within Functional Limits for tasks assessed                                        General Comments General comments (skin integrity, edema, etc.): Pt steady with feet apart and together with eyes open/closed and with head turns; no instability with static standing in semi-tandem position    Exercises     Assessment/Plan    PT Assessment Patent does not need any further PT services  PT Problem List         PT Treatment Interventions      PT Goals (Current goals can be found in the Care Plan section)  Acute Rehab PT Goals PT Goal Formulation: All assessment and education complete, DC therapy    Frequency     Barriers to discharge        Co-evaluation               AM-PAC PT "6 Clicks" Mobility  Outcome Measure Help needed turning from your back to your side while in a flat bed without using bedrails?: None Help needed moving from lying on your back to sitting on the side of a flat bed without using bedrails?: None Help needed moving to and from a bed to a chair (including a wheelchair)?: None Help needed standing up from a chair using your arms (e.g., wheelchair or bedside chair)?: None Help needed to walk in hospital room?: None Help needed climbing  3-5 steps with a railing? : None 6 Click Score: 24    End of Session Equipment Utilized During Treatment: Gait belt Activity Tolerance: Patient tolerated treatment well Patient left: in bed;with family/visitor present;with call bell/phone within reach;Other (comment) (Bilateral rails up as pt was found in the ED) Nurse Communication: Mobility status PT Visit Diagnosis: Muscle weakness (generalized) (M62.81)    Time: 8676-7209 PT Time Calculation (min) (ACUTE ONLY): 31 min   Charges:   PT Evaluation $PT Eval Low Complexity: 1 Low          D. Royetta Asal PT, DPT 08/19/20, 5:29 PM

## 2020-08-19 NOTE — H&P (Signed)
History and Physical   Rebecca Barajas NOM:[id number] DOB: 1934/09/26 DOA: 08/19/2020  PCP: Neysa Bonito, MD  Outpatient Specialists: Dr. Lyndel Safe, rheumatology Patient coming from: home via private vehicle  I have personally briefly reviewed patient's old medical records in [place].  Chief Concern: Right sided face numbness   HPI: Rebecca Barajas is a 86 y.o. female with medical history significant for hypertension, coronary artery disease of the LAD, niddm, chrohns in remission, RA and osteoarthritis, right breast intraductal (?) cancer s/p radiation in 1999 and recurrence then undergoing complete mastectomy in January 2013, presents to the emergency department for chief concerns of right-sided numbness.  She reports that her symptoms started on 08/18/2020 when she experienced numbness while putting on lipstick.  She continued to go to sleep believing it was a cold sore developing.  She woke up then with expansion of the numbness to the right cheeks and below her eyes, and right chin and right lip numbness.  She further endorses difficulty closing her right eye  She denies changes to medications, diet, or recent insect bites, including ticks.  Review of system: Denies headache, nausea, vomtiing, fever, chills, denies sick contact, chest pain, shortness of breath, abdomimnal pain, back pain, diarrhea, dysuria, blood in pea, blood in stool, weakness in arms or legs   She endorses constipation - baseline, productive cough with clear  Social history: widowed, currently living with a son.  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   ED Course: Discussed with ED provider.   Review of Systems: As per HPI otherwise 10 point review of systems negative.   Assessment/Plan  Principal Problem:   Numbness and tingling of right face Active Problems:   History of breast  cancer in female   CVA (cerebral vascular accident) ([place])   Essential hypertension   Hyperlipidemia   Brain mass   Right-sided face numbness, differentials include Bell's palsy versus CVA -Neurology has been consulted: Recommends aspirin 81 mg at home increased to 325 mg daily, Medrol taper pack on discharge -Lipid panel and A1c -Echo -PT, OT -Permissive hypertension with goal SBP less than 200 -Nifedipine 10 mg every 4 hours as needed for SBP greater than 200 -Fall precautions, aspiration precautions, seizure precautions  Left-sided tentorium 6 mm mass read on MRI-discussed with patient and daughter -Outpatient follow-up  Non-insulin-dependent diabetes mellitus- -On home Januvia 25 mg daily-patient is very sensitive to medications and asked that she can bring this into be relabeled -Holding home glipizide 10 mg daily -Insulin SSI and at bedtime coverage  CAD-appears stable at this time, resumed home ezetimibe-simvastatin 10-25 nightly -Holding home antihypertensives for permissive hypertension at this time  Hypertension-elevated, allowing permissive hypertension -Nifedipine as above  Weight loss-Per patient intentional -2 years prior she was about 210 pounds -Currently she is 160 pounds Chart reviewed.   Difficulty to close her right eye-right eye patch -Nursing communication order placed for right eye patch  DVT prophylaxis: Holding due to acute CVA Code Status: Full code Diet: Heart healthy/carb modified Family Communication: Updated daughter and discussed with daughter at bedside Disposition Plan: Pending clinical course Consults called: Neurology Admission status: Observation with telemetry  Past Medical History:  Diagnosis Date  . Anemia   . Breast cancer, right breast ([place])    . Initially diagnosed in 1998, stage IA, T1cN0Mx, ER positive, PR negative, HER2 unamplified of the right breast. Lumpectomy and  axilla node dissection And radiation, no adjuvant  treatment secondary to size of tumor. B. 07/27/11, annual mammogram: asymmetry lateral right breast. Asymmetry inferior aspect of right breast unchanged from prior studies, deep to region of scar marker. No masses, calci  . CAD (coronary artery disease)   . Coronary artery disease   . Crohn's disease ([place])   . Diabetes mellitus type 2 in nonobese (HCC)   . Esophageal stricture   . GERD (gastroesophageal reflux disease)   . Hyperlipidemia   . Hypertension   . Inflammatory bowel disease    with high-grade stenmanifested by Crohn's disease diagnosed in 1999  . Osteoarthritis   . Rheumatoid arthritis [facility])    Past Surgical History:  Procedure Laterality Date  . ABDOMINAL HYSTERECTOMY    . APPENDECTOMY    . BREAST BIOPSY Right 2012   Positive  . BREAST EXCISIONAL BIOPSY Left 1980's  . CAROTID ENDARTERECTOMY    . MASTECTOMY PARTIAL / LUMPECTOMY W/ AXILLARY LYMPHADENECTOMY  2013  . SIMPLE MASTECTOMY    . TONSILLECTOMY AND ADENOIDECTOMY    . TOTAL KNEE ARTHROPLASTY    . VASCULAR SURGERY     Social History:  reports that she has never smoked. She has never used smokeless tobacco. She reports that she does not drink alcohol and does not use drugs.  Allergies  Allergen Reactions  . Levofloxacin Other (See Comments) and Diarrhea    Diarrhea, nausea, rectal bleeding, mouth sores DIARRHEA WITH RECTAL BLEEDING Other reaction(s): Bleeding Diarrhea, nausea, rectal bleeding, mouth sores  . Antihistamines, Diphenhydramine-Type Other (See Comments)    hypertension  . Amlodipine Itching    Intolerant Intolerant  . Fosinopril Itching    Intolerant  . Fosinopril Sodium-Hctz     Intolerant  . Loratadine Other (See Comments)    all antihistamines  . Methotrexate Diarrhea  . Nsaids Diarrhea    Diarrhea/abdominal cramping Diarrhea/abdominal cramping   Family History  Problem Relation Age of Onset  . Uterine cancer Sister   . Breast cancer Maternal Aunt 55  . Breast cancer Paternal Aunt    . Alzheimer's disease Mother   . Hypertension Mother   . Hypertension Father   . Gallbladder disease Father   . Alzheimer's disease Father   . Breast cancer Daughter 5   Family history: Family history reviewed and not pertinent. Negative family history of cva.   Prior to Admission medications   Medication Sig Start Date End Date Taking? Authorizing Provider  aspirin 81 MG tablet Take 81 mg by mouth.    [provider]  atenolol (TENORMIN) 25 MG tablet TAKE ONE TABLET BY MOUTH TWICE DAILY. 03/18/15   [provider]  Cholecalciferol (VITAMIN D) 2000 UNITS tablet Take 2,000 Units by mouth. 04/11/12   [provider]  ezetimibe-simvastatin (VYTORIN) 10-20 MG per tablet Take 1 tablet by mouth. 11/29/14   [provider]  folic acid (FOLVITE) 1 MG tablet TAKE (1) TABLET BY MOUTH EVERY DAY 02/04/15   [provider]  glipiZIDE (GLUCOTROL XL) 10 MG 24 hr tablet Take by mouth. 01/21/16   [provider]  hydrochlorothiazide (HYDRODIURIL) 12.5 MG tablet Take 12.5 mg by mouth. 11/29/14 11/29/15  [provider]  hydrochlorothiazide (HYDRODIURIL) 12.5 MG tablet Take by mouth. 11/29/14   [provider]  irbesartan (AVAPRO) 150 MG tablet Take 150 mg by mouth. 01/10/15 01/10/19  [provider]  Multiple Vitamins-Calcium (VIACTIV MULTI-VITAMIN) CHEW Chew by mouth.    [provider]  Omeprazole 20 MG TBEC Take  by mouth.    [provider]  predniSONE (DELTASONE) 5 MG tablet Take 5 mg by mouth.    [provider]  ranitidine (ZANTAC) 300 MG tablet Take by mouth. 02/28/16 02/27/17  [provider]  sitaGLIPtin (JANUVIA) 25 MG tablet Take by mouth. 01/21/16 01/20/17  [provider]  sulfaSALAzine (AZULFIDINE) 500 MG tablet Take by mouth.    [provider]   Physical Exam: Vitals:   08/19/20 1315 08/19/20 1330 08/19/20 1345 08/19/20 1400  BP:  (!) 176/73  (!) 144/100  Pulse: (!) 51 (!)  51 (!) 51 (!) 51  Resp: 13 (!) $Remo'9 15 12  'vVcZy$ Temp:      TempSrc:      SpO2: 100% 99% 100% 100%  Weight:      Height:       Constitutional: appears age-appropriate, NAD, calm, comfortable Eyes: PERRL, lids and conjunctivae normal, difficulty closing right eye ENMT: Mucous membranes are moist. Posterior pharynx clear of any exudate or lesions. Age-appropriate dentition. Hearing appropriate.  Right-sided lip drooping Neck: normal, supple, no masses, no thyromegaly Respiratory: clear to auscultation bilaterally, no wheezing, no crackles. Normal respiratory effort. No accessory muscle use.  Cardiovascular: Regular rate and rhythm, no murmurs / rubs / gallops. No extremity edema. 2+ pedal pulses. No carotid bruits.  Abdomen: no tenderness, no masses palpated, no hepatosplenomegaly. Bowel sounds positive.  Musculoskeletal: no clubbing / cyanosis. No joint deformity upper and lower extremities. Good ROM, no contractures, no atrophy. Normal muscle tone.  Skin: no rashes, lesions, ulcers. No induration Neurologic: Sensation intact. Strength 5/5 in all 4.  Psychiatric: Normal judgment and insight. Alert and oriented x 3. Normal mood.   EKG: Independently reviewed, showing sinus bradycardia with rate of 57, QTc 402  Imaging on Admission: Personally reviewed and I agree with radiologist reading as below.  CT HEAD WO CONTRAST  Result Date: 08/19/2020 CLINICAL DATA:  Neuro deficit, acute, stroke suspected EXAM: CT HEAD WITHOUT CONTRAST TECHNIQUE: Contiguous axial images were obtained from the base of the skull through the vertex without intravenous contrast. COMPARISON:  10/07/2012 head CT and prior. FINDINGS: Brain: No acute infarct or intracranial hemorrhage. No mass lesion. No midline shift, ventriculomegaly or extra-axial fluid collection. Vascular: No hyperdense vessel or unexpected calcification. Bilateral skull base atherosclerotic calcifications. Skull: Negative for fracture or focal lesion.  Sinuses/Orbits: Normal orbits. Clear paranasal sinuses. No mastoid effusion. Other: None. IMPRESSION: No acute intracranial process. Electronically Signed   By: Primitivo Gauze M.D.   On: 08/19/2020 10:04   MR BRAIN WO CONTRAST  Result Date: 08/19/2020 CLINICAL DATA:  Neuro deficit, acute, stroke suspected. Additional provided: Right-sided facial numbness since awakening this morning. EXAM: MRI HEAD WITHOUT CONTRAST TECHNIQUE: Multiplanar, multiecho pulse sequences of the brain and surrounding structures were obtained without intravenous contrast. COMPARISON:  Head CT performed earlier the same day 08/19/2020. Brain MRI 11/20/2005. FINDINGS: Brain: Mild generalized cerebral atrophy. There is a 2 mm focus of restricted diffusion within the posterior left frontal lobe (motor strip), consistent with acute infarction (series 5, image 40). Mild multifocal T2/FLAIR hyperintensity within the cerebral white matter is nonspecific, but compatible with chronic small vessel ischemic disease. These findings have progressed as compared to the brain MRI of 11/20/2005. Apparent 6 mm mass along the left tentorium demonstrating homogeneous T2 intermediate signal (series 10, image 7) (series 17, image 11). This is a new finding as compared to the prior MRI and likely reflects an incidental small meningioma. Chronic microhemorrhage within the right basal ganglia.  No extra-axial fluid collection. No midline shift. Vascular: Expected proximal arterial flow voids. Skull and upper cervical spine: No focal marrow lesion. Sinuses/Orbits: Visualized orbits show no acute finding. Mild ethmoid and maxillary sinus mucosal thickening. Other: Small bilateral mastoid effusions IMPRESSION: 2 mm acute infarct within the left frontal lobe motor strip. Suspected 6 mm meningioma along the left tentorium, a new finding as compared to the brain MRI of 11/20/2005. Mild cerebral atrophy and chronic small vessel ischemic disease, progressed. Mild  bilateral ethmoid and maxillary sinus mucosal thickening. Small bilateral mastoid effusions. Electronically Signed   By: Kellie Simmering DO   On: 08/19/2020 11:29   Labs on Admission: I have personally reviewed following labs  CBC: Recent Labs  Lab 08/19/20 0948  WBC 5.7  NEUTROABS 3.1  HGB 11.8*  HCT 36.4  MCV 89.4  PLT 831   Basic Metabolic Panel: Recent Labs  Lab 08/19/20 0948  NA 138  K 4.4  CL 101  CO2 25  GLUCOSE 169*  BUN 37*  CREATININE 1.47*  CALCIUM 9.7   GFR: Estimated Creatinine Clearance: 27.7 mL/min (A) (by C-G formula based on SCr of 1.47 mg/dL (H)).  Liver Function Tests: Recent Labs  Lab 08/19/20 0948  AST 22  ALT 19  ALKPHOS 53  BILITOT 0.7  PROT 6.8  ALBUMIN 3.7   Rebecca Barajas N Emaya Preston D.O. [facility]  If 12AM-7AM, please contact overnight-coverage provider If 7AM-7PM, please contact day coverage provider [email or web address]  08/19/2020, 2:48 PM

## 2020-08-19 NOTE — ED Notes (Signed)
Pt reports taking all bp meds this am PTA

## 2020-08-19 NOTE — Consult Note (Signed)
Reason for Consult: Facial droop and difficulty closing R eye.  Requesting Physician: Dr. Cinda Quest   CC: Facial droop and difficulty closing R eye.    HPI: Rebecca Barajas is an 86 y.o. female with hx of CAD, HTN, DM last known will this AM. Pt woke up with difficulty closing R eye and R facial droop that she noticed this AM.    Past Medical History:  Diagnosis Date  . Anemia   . Breast cancer, right breast ([place])    . Initially diagnosed in 1998, stage IA, T1cN0Mx, ER positive, PR negative, HER2 unamplified of the right breast. Lumpectomy and axilla node dissection And radiation, no adjuvant treatment secondary to size of tumor. B. 07/27/11, annual mammogram: asymmetry lateral right breast. Asymmetry inferior aspect of right breast unchanged from prior studies, deep to region of scar marker. No masses, calci  . CAD (coronary artery disease)   . Coronary artery disease   . Crohn's disease ([place])   . Diabetes mellitus type 2 in nonobese ([facility])   . Esophageal stricture   . GERD (gastroesophageal reflux disease)   . Hyperlipidemia   . Hypertension   . Inflammatory bowel disease    with high-grade stenmanifested by Crohn's disease diagnosed in 1999  . Osteoarthritis   . Rheumatoid arthritis [facility])     Past Surgical History:  Procedure Laterality Date  . ABDOMINAL HYSTERECTOMY    . APPENDECTOMY    . BREAST BIOPSY Right 2012   Positive  . BREAST EXCISIONAL BIOPSY Left 1980's  . CAROTID ENDARTERECTOMY    . MASTECTOMY PARTIAL / LUMPECTOMY W/ AXILLARY LYMPHADENECTOMY  2013  . SIMPLE MASTECTOMY    . TONSILLECTOMY AND ADENOIDECTOMY    . TOTAL KNEE ARTHROPLASTY    . VASCULAR SURGERY      Family History  Problem Relation Age of Onset  . Uterine cancer Sister   . Breast cancer Maternal Aunt 94  . Breast cancer Paternal Aunt   . Alzheimer's disease Mother   . Hypertension Mother   . Hypertension Father   . Gallbladder disease Father   . Alzheimer's disease Father   . Breast cancer  Daughter 59    Social History:  reports that she has never smoked. She has never used smokeless tobacco. She reports that she does not drink alcohol and does not use drugs.  Allergies  Allergen Reactions  . Levofloxacin Other (See Comments) and Diarrhea    Diarrhea, nausea, rectal bleeding, mouth sores DIARRHEA WITH RECTAL BLEEDING Other reaction(s): Bleeding Diarrhea, nausea, rectal bleeding, mouth sores  . Antihistamines, Diphenhydramine-Type Other (See Comments)    hypertension  . Amlodipine Itching    Intolerant Intolerant  . Fosinopril Itching    Intolerant  . Fosinopril Sodium-Hctz     Intolerant  . Loratadine Other (See Comments)    all antihistamines  . Methotrexate Diarrhea  . Nsaids Diarrhea    Diarrhea/abdominal cramping Diarrhea/abdominal cramping    Medications: I have reviewed the patient's current medications.  ROS: History obtained from the patient  General ROS: negative for - chills, fatigue, fever, night sweats, weight gain or weight loss Psychological ROS: negative for - behavioral disorder, hallucinations, memory difficulties, mood swings or suicidal ideation Ophthalmic ROS: negative for - blurry vision, double vision, eye pain or loss of vision ENT ROS: negative for - epistaxis, nasal discharge, oral lesions, sore throat, tinnitus or vertigo Allergy and Immunology ROS: negative for - hives or itchy/watery eyes Hematological and Lymphatic ROS: negative for - bleeding problems, bruising or  swollen lymph nodes Endocrine ROS: negative for - galactorrhea, hair pattern changes, polydipsia/polyuria or temperature intolerance Respiratory ROS: negative for - cough, hemoptysis, shortness of breath or wheezing Cardiovascular ROS: negative for - chest pain, dyspnea on exertion, edema or irregular heartbeat Gastrointestinal ROS: negative for - abdominal pain, diarrhea, hematemesis, nausea/vomiting or stool incontinence Genito-Urinary ROS: negative for - dysuria,  hematuria, incontinence or urinary frequency/urgency Musculoskeletal ROS: negative for - joint swelling or muscular weakness Neurological ROS: as noted in HPI Dermatological ROS: negative for rash and skin lesion changes  Physical Examination: Blood pressure (!) 189/86, pulse (!) 56, temperature 98.3 F (36.8 C), temperature source Oral, resp. rate 20, height $RemoveBe'5\' 7"'GIdgLrqLh$  (1.702 m), weight 72.6 kg, SpO2 100 %.   Neurological Examination   Mental Status: Alert, oriented, thought content appropriate.  Speech fluent without evidence of aphasia.  Able to follow 3 step commands without difficulty. Cranial Nerves: II: Discs flat bilaterally; Visual fields grossly normal, pupils equal, round, reactive to light and accommodation III,IV, VI: ptosis not present, extra-ocular motions intact bilaterally V,VII: R facial droop and difficulty closing R eye VIII: hearing normal bilaterally XI: bilateral shoulder shrug XII: midline tongue extension Motor: Right : Upper extremity   5/5    Left:     Upper extremity   5/5  Lower extremity   5/5     Lower extremity   5/5 Tone and bulk:normal tone throughout; no atrophy noted Sensory: Pinprick and light touch intact throughout, bilaterally Deep Tendon Reflexes: 2+ and symmetric throughout   Laboratory Studies:   Basic Metabolic Panel: Recent Labs  Lab 08/19/20 0948  NA 138  K 4.4  CL 101  CO2 25  GLUCOSE 169*  BUN 37*  CREATININE 1.47*  CALCIUM 9.7    Liver Function Tests: Recent Labs  Lab 08/19/20 0948  AST 22  ALT 19  ALKPHOS 53  BILITOT 0.7  PROT 6.8  ALBUMIN 3.7   No results for input(s): LIPASE, AMYLASE in the last 168 hours. No results for input(s): AMMONIA in the last 168 hours.  CBC: Recent Labs  Lab 08/19/20 0948  WBC 5.7  NEUTROABS 3.1  HGB 11.8*  HCT 36.4  MCV 89.4  PLT 192    Cardiac Enzymes: No results for input(s): CKTOTAL, CKMB, CKMBINDEX, TROPONINI in the last 168 hours.  BNP: Invalid input(s):  POCBNP  CBG: No results for input(s): GLUCAP in the last 168 hours.  Microbiology: No results found for this or any previous visit.  Coagulation Studies: No results for input(s): LABPROT, INR in the last 72 hours.  Urinalysis: No results for input(s): COLORURINE, LABSPEC, PHURINE, GLUCOSEU, HGBUR, BILIRUBINUR, KETONESUR, PROTEINUR, UROBILINOGEN, NITRITE, LEUKOCYTESUR in the last 168 hours.  Invalid input(s): APPERANCEUR  Lipid Panel:  No results found for: CHOL, TRIG, HDL, CHOLHDL, VLDL, LDLCALC  HgbA1C:  Lab Results  Component Value Date   HGBA1C 6.8 (H) 01/09/2015    Urine Drug Screen:  No results found for: LABOPIA, COCAINSCRNUR, LABBENZ, AMPHETMU, THCU, LABBARB  Alcohol Level: No results for input(s): ETH in the last 168 hours.  Other results: EKG: normal EKG, normal sinus rhythm, unchanged from previous tracings.  Imaging: CT HEAD WO CONTRAST  Result Date: 08/19/2020 CLINICAL DATA:  Neuro deficit, acute, stroke suspected EXAM: CT HEAD WITHOUT CONTRAST TECHNIQUE: Contiguous axial images were obtained from the base of the skull through the vertex without intravenous contrast. COMPARISON:  10/07/2012 head CT and prior. FINDINGS: Brain: No acute infarct or intracranial hemorrhage. No mass lesion. No midline shift, ventriculomegaly  or extra-axial fluid collection. Vascular: No hyperdense vessel or unexpected calcification. Bilateral skull base atherosclerotic calcifications. Skull: Negative for fracture or focal lesion. Sinuses/Orbits: Normal orbits. Clear paranasal sinuses. No mastoid effusion. Other: None. IMPRESSION: No acute intracranial process. Electronically Signed   By: Primitivo Gauze M.D.   On: 08/19/2020 10:04   MR BRAIN WO CONTRAST  Result Date: 08/19/2020 CLINICAL DATA:  Neuro deficit, acute, stroke suspected. Additional provided: Right-sided facial numbness since awakening this morning. EXAM: MRI HEAD WITHOUT CONTRAST TECHNIQUE: Multiplanar, multiecho pulse  sequences of the brain and surrounding structures were obtained without intravenous contrast. COMPARISON:  Head CT performed earlier the same day 08/19/2020. Brain MRI 11/20/2005. FINDINGS: Brain: Mild generalized cerebral atrophy. There is a 2 mm focus of restricted diffusion within the posterior left frontal lobe (motor strip), consistent with acute infarction (series 5, image 40). Mild multifocal T2/FLAIR hyperintensity within the cerebral white matter is nonspecific, but compatible with chronic small vessel ischemic disease. These findings have progressed as compared to the brain MRI of 11/20/2005. Apparent 6 mm mass along the left tentorium demonstrating homogeneous T2 intermediate signal (series 10, image 7) (series 17, image 11). This is a new finding as compared to the prior MRI and likely reflects an incidental small meningioma. Chronic microhemorrhage within the right basal ganglia. No extra-axial fluid collection. No midline shift. Vascular: Expected proximal arterial flow voids. Skull and upper cervical spine: No focal marrow lesion. Sinuses/Orbits: Visualized orbits show no acute finding. Mild ethmoid and maxillary sinus mucosal thickening. Other: Small bilateral mastoid effusions IMPRESSION: 2 mm acute infarct within the left frontal lobe motor strip. Suspected 6 mm meningioma along the left tentorium, a new finding as compared to the brain MRI of 11/20/2005. Mild cerebral atrophy and chronic small vessel ischemic disease, progressed. Mild bilateral ethmoid and maxillary sinus mucosal thickening. Small bilateral mastoid effusions. Electronically Signed   By: Kellie Simmering DO   On: 08/19/2020 11:29     Assessment/Plan:  86 y.o. female with hx of CAD, HTN, DM last known will this AM. Pt woke up with difficulty closing R eye and R facial droop that she noticed this AM.    - Based on examination this is Bell's Palsy  - MRI reviewed she does have small L frontal stroke in setting of small vessel  disease. I am not sure how much if any it is contributing to her symptoms and would not cause difficulty closing R eye - given the risk factors that she has would admit/observe orvernight with echo/pt/ot prior to d/c tomorrow - on d/c medrol taper pack for Bells Palsy - ASA $Rem'81mg'EUcY$  at home increase to 325 daily - again suspect d/c when work up complete  08/19/2020, 12:41 PM

## 2020-08-19 NOTE — ED Triage Notes (Signed)
Pt via pov from home with right sided facial numbness since awakening this morning. Pt reports that she was unable to put her lipstick on right yesterday because her lips were numb but thought she might be getting a fever blister. When she awakend this morning, she had numbness on the entire right side of her face. Pt alert & oriented, nad noted.

## 2020-08-19 NOTE — Progress Notes (Addendum)
OT Cancellation Note  Patient Details Name: Rebecca Barajas MRN: [id number] DOB: May 03, 1934   Cancelled Treatment:    Reason Eval/Treat Not Completed: Active bedrest order;Other (comment). OT order received and chart reviewed. Pt noted to have active bed rest order and most recent BP outside of safe parameters for therapy evlauation at this time (BP 189/83, MAP 118). Will follow remotely, evaluate when medically appropriate.   Dessie Coma, M.S. OTR/L  08/19/20, 1:20 PM  ascom [phone]

## 2020-08-20 ENCOUNTER — Observation Stay
Admit: 2020-08-20 | Discharge: 2020-08-20 | Disposition: A | Discharge status: Home / Self Care | Payer: Medicare Other | Attending: Internal Medicine | Admitting: Internal Medicine

## 2020-08-20 ENCOUNTER — Observation Stay: Payer: Medicare Other

## 2020-08-20 DIAGNOSIS — R200 Anesthesia of skin: Secondary | ICD-10-CM

## 2020-08-20 DIAGNOSIS — R202 Paresthesia of skin: Secondary | ICD-10-CM | POA: Diagnosis not present

## 2020-08-20 DIAGNOSIS — I639 Cerebral infarction, unspecified: Secondary | ICD-10-CM | POA: Diagnosis not present

## 2020-08-20 LAB — HEMOGLOBIN A1C
Hgb A1c MFr Bld: 7.9 % — ABNORMAL HIGH (ref 4.8–5.6)
Mean Plasma Glucose: 180.03 mg/dL

## 2020-08-20 LAB — LIPID PANEL
Cholesterol: 140 mg/dL (ref 0–200)
HDL: 35 mg/dL — ABNORMAL LOW (ref >40)
LDL Cholesterol: 73 mg/dL (ref 0–99)
Total CHOL/HDL Ratio: 4 RATIO
Triglycerides: 160 mg/dL — ABNORMAL HIGH (ref <150)
VLDL: 32 mg/dL (ref 0–40)

## 2020-08-20 LAB — GLUCOSE, CAPILLARY
Glucose-Capillary: 108 mg/dL — ABNORMAL HIGH (ref 70–99)
Glucose-Capillary: 167 mg/dL — ABNORMAL HIGH (ref 70–99)

## 2020-08-20 LAB — PROTIME-INR
INR: 1 (ref 0.8–1.2)
Prothrombin Time: 12.6 seconds (ref 11.4–15.2)

## 2020-08-20 LAB — APTT: aPTT: 31 seconds (ref 24–36)

## 2020-08-20 MED ORDER — METHYLPREDNISOLONE 4 MG PO TBPK: ORAL_TABLET | ORAL | 0 refills | Status: AC

## 2020-08-20 MED ORDER — ASPIRIN 81 MG PO TABS: 325.0000 mg | ORAL_TABLET | Freq: Every day | ORAL | Status: AC

## 2020-08-20 MED ORDER — ATENOLOL 25 MG PO TABS: ORAL_TABLET | ORAL | Status: AC

## 2020-08-20 NOTE — Progress Notes (Signed)
Subjective: No changes overnight. Still R eye difficulty closing.    HPI: Rebecca Barajas is an 86 y.o. female with hx of CAD, HTN, DM last known will this AM. Pt woke up with difficulty closing R eye and R facial droop that she noticed this AM.    Past Medical History:  Diagnosis Date  . Anemia   . Breast cancer, right breast ([place])    . Initially diagnosed in 1998, stage IA, T1cN0Mx, ER positive, PR negative, HER2 unamplified of the right breast. Lumpectomy and axilla node dissection And radiation, no adjuvant treatment secondary to size of tumor. B. 07/27/11, annual mammogram: asymmetry lateral right breast. Asymmetry inferior aspect of right breast unchanged from prior studies, deep to region of scar marker. No masses, calci  . CAD (coronary artery disease)   . Coronary artery disease   . Crohn's disease ([place])   . Diabetes mellitus type 2 in nonobese ([facility])   . Esophageal stricture   . GERD (gastroesophageal reflux disease)   . Hyperlipidemia   . Hypertension   . Inflammatory bowel disease    with high-grade stenmanifested by Crohn's disease diagnosed in 1999  . Osteoarthritis   . Rheumatoid arthritis [facility])     Past Surgical History:  Procedure Laterality Date  . ABDOMINAL HYSTERECTOMY    . APPENDECTOMY    . BREAST BIOPSY Right 2012   Positive  . BREAST EXCISIONAL BIOPSY Left 1980's  . CAROTID ENDARTERECTOMY    . MASTECTOMY PARTIAL / LUMPECTOMY W/ AXILLARY LYMPHADENECTOMY  2013  . SIMPLE MASTECTOMY    . TONSILLECTOMY AND ADENOIDECTOMY    . TOTAL KNEE ARTHROPLASTY    . VASCULAR SURGERY      Family History  Problem Relation Age of Onset  . Uterine cancer Sister   . Breast cancer Maternal Aunt 62  . Breast cancer Paternal Aunt   . Alzheimer's disease Mother   . Hypertension Mother   . Hypertension Father   . Gallbladder disease Father   . Alzheimer's disease Father   . Breast cancer Daughter 14    Social History:  reports that she has never smoked. She has never used  smokeless tobacco. She reports that she does not drink alcohol and does not use drugs.  Allergies  Allergen Reactions  . Levofloxacin Other (See Comments) and Diarrhea    Diarrhea, nausea, rectal bleeding, mouth sores DIARRHEA WITH RECTAL BLEEDING Other reaction(s): Bleeding Diarrhea, nausea, rectal bleeding, mouth sores  . Antihistamines, Diphenhydramine-Type Other (See Comments)    hypertension  . Amlodipine Itching    Intolerant Intolerant  . Fosinopril Itching    Intolerant  . Fosinopril Sodium-Hctz     Intolerant  . Loratadine Other (See Comments)    all antihistamines  . Methotrexate Diarrhea  . Nsaids Diarrhea    Diarrhea/abdominal cramping Diarrhea/abdominal cramping    Medications: I have reviewed the patient's current medications.  ROS: History obtained from the patient  General ROS: negative for - chills, fatigue, fever, night sweats, weight gain or weight loss Psychological ROS: negative for - behavioral disorder, hallucinations, memory difficulties, mood swings or suicidal ideation Ophthalmic ROS: negative for - blurry vision, double vision, eye pain or loss of vision ENT ROS: negative for - epistaxis, nasal discharge, oral lesions, sore throat, tinnitus or vertigo Allergy and Immunology ROS: negative for - hives or itchy/watery eyes Hematological and Lymphatic ROS: negative for - bleeding problems, bruising or swollen lymph nodes Endocrine ROS: negative for - galactorrhea, hair pattern changes, polydipsia/polyuria or temperature intolerance  Respiratory ROS: negative for - cough, hemoptysis, shortness of breath or wheezing Cardiovascular ROS: negative for - chest pain, dyspnea on exertion, edema or irregular heartbeat Gastrointestinal ROS: negative for - abdominal pain, diarrhea, hematemesis, nausea/vomiting or stool incontinence Genito-Urinary ROS: negative for - dysuria, hematuria, incontinence or urinary frequency/urgency Musculoskeletal ROS: negative for -  joint swelling or muscular weakness Neurological ROS: as noted in HPI Dermatological ROS: negative for rash and skin lesion changes  Physical Examination: Blood pressure (!) 146/72, pulse (!) 59, temperature 97.8 F (36.6 C), resp. rate 16, height _0  (1.702 m), weight 72.6 kg, SpO2 99 %.   Neurological Examination   Mental Status: Alert, oriented, thought content appropriate.  Speech fluent without evidence of aphasia.  Able to follow 3 step commands without difficulty. Cranial Nerves: II: Discs flat bilaterally; Visual fields grossly normal, pupils equal, round, reactive to light and accommodation III,IV, VI: ptosis not present, extra-ocular motions intact bilaterally V,VII: R facial droop and difficulty closing R eye VIII: hearing normal bilaterally XI: bilateral shoulder shrug XII: midline tongue extension Motor: Right : Upper extremity   5/5    Left:     Upper extremity   5/5  Lower extremity   5/5     Lower extremity   5/5 Tone and bulk:normal tone throughout; no atrophy noted Sensory: Pinprick and light touch intact throughout, bilaterally Deep Tendon Reflexes: 2+ and symmetric throughout   Laboratory Studies:   Basic Metabolic Panel: Recent Labs  Lab 08/19/20 0948  NA 138  K 4.4  CL 101  CO2 25  GLUCOSE 169*  BUN 37*  CREATININE 1.47*  CALCIUM 9.7    Liver Function Tests: Recent Labs  Lab 08/19/20 0948  AST 22  ALT 19  ALKPHOS 53  BILITOT 0.7  PROT 6.8  ALBUMIN 3.7   No results for input(s): LIPASE, AMYLASE in the last 168 hours. No results for input(s): AMMONIA in the last 168 hours.  CBC: Recent Labs  Lab 08/19/20 0948  WBC 5.7  NEUTROABS 3.1  HGB 11.8*  HCT 36.4  MCV 89.4  PLT 192    Cardiac Enzymes: No results for input(s): CKTOTAL, CKMB, CKMBINDEX, TROPONINI in the last 168 hours.  BNP: Invalid input(s): POCBNP  CBG: Recent Labs  Lab 08/19/20 1749 08/20/20 0813  GLUCAP 172* 108*    Microbiology: Results for orders  placed or performed during the hospital encounter of 08/19/20  Resp Panel by RT-PCR (Flu A&B, Covid) Nasopharyngeal Swab     Status: None   Collection Time: 08/19/20 12:54 PM   Specimen: Nasopharyngeal Swab; Nasopharyngeal(NP) swabs in vial transport medium  Result Value Ref Range Status   SARS Coronavirus 2 by RT PCR NEGATIVE NEGATIVE Final    Comment: (NOTE) SARS-CoV-2 target nucleic acids are NOT DETECTED.  The SARS-CoV-2 RNA is generally detectable in upper respiratory specimens during the acute phase of infection. The lowest concentration of SARS-CoV-2 viral copies this assay can detect is 138 copies/mL. A negative result does not preclude SARS-Cov-2 infection and should not be used as the sole basis for treatment or other patient management decisions. A negative result may occur with  improper specimen collection/handling, submission of specimen other than nasopharyngeal swab, presence of viral mutation(s) within the areas targeted by this assay, and inadequate number of viral copies(<138 copies/mL). A negative result must be combined with clinical observations, patient history, and epidemiological information. The expected result is Negative.  Fact Sheet for Patients:  EntrepreneurPulse.com.au  Fact Sheet for Healthcare Providers:  IncredibleEmployment.be  This test is no t yet approved or cleared by the [place] and  has been authorized for detection and/or diagnosis of SARS-CoV-2 by FDA under an Emergency Use Authorization (EUA). This EUA will remain  in effect (meaning this test can be used) for the duration of the COVID-19 declaration under Section 564(b)(1) of the Act, 21 U.S.C.section 360bbb-3(b)(1), unless the authorization is terminated  or revoked sooner.       Influenza A by PCR NEGATIVE NEGATIVE Final   Influenza B by PCR NEGATIVE NEGATIVE Final    Comment: (NOTE) The Xpert Xpress SARS-CoV-2/FLU/RSV plus assay is  intended as an aid in the diagnosis of influenza from Nasopharyngeal swab specimens and should not be used as a sole basis for treatment. Nasal washings and aspirates are unacceptable for Xpert Xpress SARS-CoV-2/FLU/RSV testing.  Fact Sheet for Patients: EntrepreneurPulse.com.au  Fact Sheet for Healthcare Providers: IncredibleEmployment.be  This test is not yet approved or cleared by the [place] FDA and has been authorized for detection and/or diagnosis of SARS-CoV-2 by FDA under an Emergency Use Authorization (EUA). This EUA will remain in effect (meaning this test can be used) for the duration of the COVID-19 declaration under Section 564(b)(1) of the Act, 21 U.S.C. section 360bbb-3(b)(1), unless the authorization is terminated or revoked.  Performed at [facility], [address] [place], [place] [address]     Coagulation Studies: No results for input(s): LABPROT, INR in the last 72 hours.  Urinalysis: No results for input(s): COLORURINE, LABSPEC, PHURINE, GLUCOSEU, HGBUR, BILIRUBINUR, KETONESUR, PROTEINUR, UROBILINOGEN, NITRITE, LEUKOCYTESUR in the last 168 hours.  Invalid input(s): APPERANCEUR  Lipid Panel:     Component Value Date/Time   CHOL 140 08/20/2020 0453   TRIG 160 (H) 08/20/2020 0453   HDL 35 (L) 08/20/2020 0453   CHOLHDL 4.0 08/20/2020 0453   VLDL 32 08/20/2020 0453   LDLCALC 73 08/20/2020 0453    HgbA1C:  Lab Results  Component Value Date   HGBA1C 7.9 (H) 08/20/2020    Urine Drug Screen:  No results found for: LABOPIA, COCAINSCRNUR, LABBENZ, AMPHETMU, THCU, LABBARB  Alcohol Level: No results for input(s): ETH in the last 168 hours.  Other results: EKG: normal EKG, normal sinus rhythm, unchanged from previous tracings.  Imaging: CT HEAD WO CONTRAST  Result Date: 08/19/2020 CLINICAL DATA:  Neuro deficit, acute, stroke suspected EXAM: CT HEAD WITHOUT CONTRAST TECHNIQUE: Contiguous axial  images were obtained from the base of the skull through the vertex without intravenous contrast. COMPARISON:  10/07/2012 head CT and prior. FINDINGS: Brain: No acute infarct or intracranial hemorrhage. No mass lesion. No midline shift, ventriculomegaly or extra-axial fluid collection. Vascular: No hyperdense vessel or unexpected calcification. Bilateral skull base atherosclerotic calcifications. Skull: Negative for fracture or focal lesion. Sinuses/Orbits: Normal orbits. Clear paranasal sinuses. No mastoid effusion. Other: None. IMPRESSION: No acute intracranial process. Electronically Signed   By: Primitivo Gauze M.D.   On: 08/19/2020 10:04   MR BRAIN WO CONTRAST  Result Date: 08/19/2020 CLINICAL DATA:  Neuro deficit, acute, stroke suspected. Additional provided: Right-sided facial numbness since awakening this morning. EXAM: MRI HEAD WITHOUT CONTRAST TECHNIQUE: Multiplanar, multiecho pulse sequences of the brain and surrounding structures were obtained without intravenous contrast. COMPARISON:  Head CT performed earlier the same day 08/19/2020. Brain MRI 11/20/2005. FINDINGS: Brain: Mild generalized cerebral atrophy. There is a 2 mm focus of restricted diffusion within the posterior left frontal lobe (motor strip), consistent with acute infarction (series 5, image 40). Mild multifocal T2/FLAIR hyperintensity within  the cerebral white matter is nonspecific, but compatible with chronic small vessel ischemic disease. These findings have progressed as compared to the brain MRI of 11/20/2005. Apparent 6 mm mass along the left tentorium demonstrating homogeneous T2 intermediate signal (series 10, image 7) (series 17, image 11). This is a new finding as compared to the prior MRI and likely reflects an incidental small meningioma. Chronic microhemorrhage within the right basal ganglia. No extra-axial fluid collection. No midline shift. Vascular: Expected proximal arterial flow voids. Skull and upper cervical  spine: No focal marrow lesion. Sinuses/Orbits: Visualized orbits show no acute finding. Mild ethmoid and maxillary sinus mucosal thickening. Other: Small bilateral mastoid effusions IMPRESSION: 2 mm acute infarct within the left frontal lobe motor strip. Suspected 6 mm meningioma along the left tentorium, a new finding as compared to the brain MRI of 11/20/2005. Mild cerebral atrophy and chronic small vessel ischemic disease, progressed. Mild bilateral ethmoid and maxillary sinus mucosal thickening. Small bilateral mastoid effusions. Electronically Signed   By: Kellie Simmering DO   On: 08/19/2020 11:29     Assessment/Plan:  86 y.o. female with hx of CAD, HTN, DM last known will this AM. Pt woke up with difficulty closing R eye and R facial droop that she noticed this AM.    - Based on examination this is Bell's Palsy  - MRI reviewed she does have small L frontal stroke in setting of small vessel disease. I am not sure how much if any it is contributing to her symptoms and would not cause difficulty closing R eye - She does have L sided meningioma which is chronic.  - ASA 325 - carotid US - eye patch and Lacrilube at night.  - d/c planning today 08/20/2020, 10:39 AM

## 2020-08-20 NOTE — Discharge Summary (Signed)
Physician Discharge Summary   Rebecca Barajas  female DOB: 05/02/1934  JGO:[id number]  PCP: Neysa Bonito, MD  Admit date: 08/19/2020 Discharge date: 08/20/2020  Admitted From: home Disposition:  home Daughter updated at bedside prior to discharge.  CODE STATUS: Full code  Discharge Instructions    Discharge instructions   Complete by: As directed    Our neurologist has seen you in the hospital, and he diagnosed you with Bell's palsy.  Please take your Medrol dose pack as directed and follow up with your primary care doctor.  Neurologist also increased your aspirin 81 mg to 325 mg daily.   Dr. Enzo Bi [facility] Course:  For full details, please see H&P, progress notes, consult notes and ancillary notes.  Briefly,  Rebecca Barajas is a 86 y.o. female with medical history significant for hypertension, coronary artery disease of the LAD, niddm, chrohns in remission, RA and osteoarthritis, right breast cancer s/p radiation in 1999 and recurrence then undergoing complete mastectomy in January 2013, presented to the emergency department for chief concerns of right-sided facial numbness.  Right-sided face numbness 2/2 Bell's palsy  Neurology consulted and recommended medrol taper pack at discharge.  Pt had difficulty closing her right eye, and was recommended to tape her right eye during sleep and wear an eye patch.  Small L frontal stroke  MRI showed small L frontal stroke in setting of small vessel disease.  Neurology did not believe it's related to pt's presenting symptoms.  TTE showed no shunt.  Neurology recommended increasing home aspirin 81 to 325 mg daily.  LDL 73, so pt was discharged on home statin.  Left-sided tentorium 6 mm mass  Read on MRI.  Discussed with patient and daughter.  Outpatient follow-up  Non-insulin-dependent diabetes mellitus A1c 7.9.  Pt discharged on home regimen.  CAD Home ASA 81 increased to 325 mg daily.  Home atenolol held  until outpatient followup due to low HR.  Continued home Avapro and statin.  Hypertension Home BP meds held during hospitalization for permissive hypertension.  Home atenolol held until outpatient followup due to low HR.  Home Avapro and HCTZ continued after discharge.   Discharge Diagnoses:  Principal Problem:   Numbness and tingling of right face Active Problems:   History of breast cancer in female   CVA (cerebral vascular accident) ([place])   Essential hypertension   Hyperlipidemia   Brain mass   Weight loss    Discharge Instructions:  Allergies as of 08/20/2020      Reactions   Levofloxacin Other (See Comments), Diarrhea   Diarrhea, nausea, rectal bleeding, mouth sores DIARRHEA WITH RECTAL BLEEDING Other reaction(s): Bleeding Diarrhea, nausea, rectal bleeding, mouth sores   Antihistamines, Diphenhydramine-type Other (See Comments)   hypertension   Amlodipine Itching   Intolerant Intolerant   Fosinopril Itching   Intolerant   Fosinopril Sodium-hctz    Intolerant   Loratadine Other (See Comments)   all antihistamines   Methotrexate Diarrhea   Nsaids Diarrhea   Diarrhea/abdominal cramping Diarrhea/abdominal cramping      Medication List    TAKE these medications   acidophilus Caps capsule Take 1 capsule by mouth daily.   aspirin 81 MG tablet Take 4 tablets (325 mg total) by mouth daily. What changed:   how much to take  when to take this   atenolol 25 MG tablet Commonly known as: TENORMIN Hold until followup with outpatient doctor due to low heart rate  even without it. What changed:   how much to take  how to take this  when to take this  additional instructions   Avapro 150 MG tablet Generic drug: irbesartan Take 150 mg by mouth.   ferrous sulfate 325 (65 FE) MG tablet Take 325 mg by mouth daily.   folic acid 1 MG tablet Commonly known as: FOLVITE Take 1 mg by mouth daily.   glipiZIDE 10 MG 24 hr tablet Commonly known as: GLUCOTROL  XL Take 10 mg by mouth daily.   hydrochlorothiazide 12.5 MG capsule Commonly known as: MICROZIDE Take 12.5 mg by mouth daily.   Januvia 50 MG tablet Generic drug: sitaGLIPtin Take 50 mg by mouth daily.   methylPREDNISolone 4 MG Tbpk tablet Commonly known as: MEDROL DOSEPAK Take as directed over 6 days   Omega-3 1000 MG Caps Take 1,000 mg by mouth 2 (two) times daily.   rosuvastatin 40 MG tablet Commonly known as: CRESTOR Take 40 mg by mouth daily.   Viactiv Multi-Vitamin Chew Chew by mouth.   Vitamin D 50 MCG (2000 UT) tablet Take 2,000 Units by mouth.        Follow-up Information    Bright, Aaron Mose, MD. Schedule an appointment as soon as possible for a visit in 1 week(s).   Specialty: Internal Medicine Contact information: [place] [address] [phone]        Anabel Bene, MD Follow up.   Specialty: Neurology Why: One of our neurologist if you need a referral.   Contact information: [facility] [place] [address] [phone]               Allergies  Allergen Reactions  . Levofloxacin Other (See Comments) and Diarrhea    Diarrhea, nausea, rectal bleeding, mouth sores DIARRHEA WITH RECTAL BLEEDING Other reaction(s): Bleeding Diarrhea, nausea, rectal bleeding, mouth sores  . Antihistamines, Diphenhydramine-Type Other (See Comments)    hypertension  . Amlodipine Itching    Intolerant Intolerant  . Fosinopril Itching    Intolerant  . Fosinopril Sodium-Hctz     Intolerant  . Loratadine Other (See Comments)    all antihistamines  . Methotrexate Diarrhea  . Nsaids Diarrhea    Diarrhea/abdominal cramping Diarrhea/abdominal cramping     The results of significant diagnostics from this hospitalization (including imaging, microbiology, ancillary and laboratory) are listed below for reference.   Consultations:   Procedures/Studies: CT HEAD WO CONTRAST  Result  Date: 08/19/2020 CLINICAL DATA:  Neuro deficit, acute, stroke suspected EXAM: CT HEAD WITHOUT CONTRAST TECHNIQUE: Contiguous axial images were obtained from the base of the skull through the vertex without intravenous contrast. COMPARISON:  10/07/2012 head CT and prior. FINDINGS: Brain: No acute infarct or intracranial hemorrhage. No mass lesion. No midline shift, ventriculomegaly or extra-axial fluid collection. Vascular: No hyperdense vessel or unexpected calcification. Bilateral skull base atherosclerotic calcifications. Skull: Negative for fracture or focal lesion. Sinuses/Orbits: Normal orbits. Clear paranasal sinuses. No mastoid effusion. Other: None. IMPRESSION: No acute intracranial process. Electronically Signed   By: Primitivo Gauze M.D.   On: 08/19/2020 10:04   MR BRAIN WO CONTRAST  Result Date: 08/19/2020 CLINICAL DATA:  Neuro deficit, acute, stroke suspected. Additional provided: Right-sided facial numbness since awakening this morning. EXAM: MRI HEAD WITHOUT CONTRAST TECHNIQUE: Multiplanar, multiecho pulse sequences of the brain and surrounding structures were obtained without intravenous contrast. COMPARISON:  Head CT performed earlier the same day 08/19/2020. Brain MRI 11/20/2005. FINDINGS: Brain: Mild generalized cerebral atrophy.  There is a 2 mm focus of restricted diffusion within the posterior left frontal lobe (motor strip), consistent with acute infarction (series 5, image 40). Mild multifocal T2/FLAIR hyperintensity within the cerebral white matter is nonspecific, but compatible with chronic small vessel ischemic disease. These findings have progressed as compared to the brain MRI of 11/20/2005. Apparent 6 mm mass along the left tentorium demonstrating homogeneous T2 intermediate signal (series 10, image 7) (series 17, image 11). This is a new finding as compared to the prior MRI and likely reflects an incidental small meningioma. Chronic microhemorrhage within the right basal  ganglia. No extra-axial fluid collection. No midline shift. Vascular: Expected proximal arterial flow voids. Skull and upper cervical spine: No focal marrow lesion. Sinuses/Orbits: Visualized orbits show no acute finding. Mild ethmoid and maxillary sinus mucosal thickening. Other: Small bilateral mastoid effusions IMPRESSION: 2 mm acute infarct within the left frontal lobe motor strip. Suspected 6 mm meningioma along the left tentorium, a new finding as compared to the brain MRI of 11/20/2005. Mild cerebral atrophy and chronic small vessel ischemic disease, progressed. Mild bilateral ethmoid and maxillary sinus mucosal thickening. Small bilateral mastoid effusions. Electronically Signed   By: Kellie Simmering DO   On: 08/19/2020 11:29      Labs: BNP (last 3 results) No results for input(s): BNP in the last 8760 hours. Basic Metabolic Panel: Recent Labs  Lab 08/19/20 0948  NA 138  K 4.4  CL 101  CO2 25  GLUCOSE 169*  BUN 37*  CREATININE 1.47*  CALCIUM 9.7   Liver Function Tests: Recent Labs  Lab 08/19/20 0948  AST 22  ALT 19  ALKPHOS 53  BILITOT 0.7  PROT 6.8  ALBUMIN 3.7   No results for input(s): LIPASE, AMYLASE in the last 168 hours. No results for input(s): AMMONIA in the last 168 hours. CBC: Recent Labs  Lab 08/19/20 0948  WBC 5.7  NEUTROABS 3.1  HGB 11.8*  HCT 36.4  MCV 89.4  PLT 192   Cardiac Enzymes: No results for input(s): CKTOTAL, CKMB, CKMBINDEX, TROPONINI in the last 168 hours. BNP: Invalid input(s): POCBNP CBG: Recent Labs  Lab 08/19/20 1749 08/20/20 0813 08/20/20 1220  GLUCAP 172* 108* 167*   D-Dimer No results for input(s): DDIMER in the last 72 hours. Hgb A1c Recent Labs    08/20/20 0453  HGBA1C 7.9*   Lipid Profile Recent Labs    08/20/20 0453  CHOL 140  HDL 35*  LDLCALC 73  TRIG 160*  CHOLHDL 4.0   Thyroid function studies No results for input(s): TSH, T4TOTAL, T3FREE, THYROIDAB in the last 72 hours.  Invalid input(s):  FREET3 Anemia work up No results for input(s): VITAMINB12, FOLATE, FERRITIN, TIBC, IRON, RETICCTPCT in the last 72 hours. Urinalysis No results found for: COLORURINE, APPEARANCEUR, LABSPEC, [place], GLUCOSEU, [place], [place], KETONESUR, PROTEINUR, UROBILINOGEN, NITRITE, LEUKOCYTESUR Sepsis Labs Invalid input(s): PROCALCITONIN,  WBC,  LACTICIDVEN Microbiology Recent Results (from the past 240 hour(s))  Resp Panel by RT-PCR (Flu A&B, Covid) Nasopharyngeal Swab     Status: None   Collection Time: 08/19/20 12:54 PM   Specimen: Nasopharyngeal Swab; Nasopharyngeal(NP) swabs in vial transport medium  Result Value Ref Range Status   SARS Coronavirus 2 by RT PCR NEGATIVE NEGATIVE Final    Comment: (NOTE) SARS-CoV-2 target nucleic acids are NOT DETECTED.  The SARS-CoV-2 RNA is generally detectable in upper respiratory specimens during the acute phase of infection. The lowest concentration of SARS-CoV-2 viral copies this assay can detect is 138 copies/mL. A negative  result does not preclude SARS-Cov-2 infection and should not be used as the sole basis for treatment or other patient management decisions. A negative result may occur with  improper specimen collection/handling, submission of specimen other than nasopharyngeal swab, presence of viral mutation(s) within the areas targeted by this assay, and inadequate number of viral copies(<138 copies/mL). A negative result must be combined with clinical observations, patient history, and epidemiological information. The expected result is Negative.  Fact Sheet for Patients:  EntrepreneurPulse.com.au  Fact Sheet for Healthcare Providers:  IncredibleEmployment.be  This test is no t yet approved or cleared by the [place] FDA and  has been authorized for detection and/or diagnosis of SARS-CoV-2 by FDA under an Emergency Use Authorization (EUA). This EUA will remain  in effect (meaning this test can be  used) for the duration of the COVID-19 declaration under Section 564(b)(1) of the Act, 21 U.S.C.section 360bbb-3(b)(1), unless the authorization is terminated  or revoked sooner.       Influenza A by PCR NEGATIVE NEGATIVE Final   Influenza B by PCR NEGATIVE NEGATIVE Final    Comment: (NOTE) The Xpert Xpress SARS-CoV-2/FLU/RSV plus assay is intended as an aid in the diagnosis of influenza from Nasopharyngeal swab specimens and should not be used as a sole basis for treatment. Nasal washings and aspirates are unacceptable for Xpert Xpress SARS-CoV-2/FLU/RSV testing.  Fact Sheet for Patients: EntrepreneurPulse.com.au  Fact Sheet for Healthcare Providers: IncredibleEmployment.be  This test is not yet approved or cleared by the [place] FDA and has been authorized for detection and/or diagnosis of SARS-CoV-2 by FDA under an Emergency Use Authorization (EUA). This EUA will remain in effect (meaning this test can be used) for the duration of the COVID-19 declaration under Section 564(b)(1) of the Act, 21 U.S.C. section 360bbb-3(b)(1), unless the authorization is terminated or revoked.  Performed at [facility], [address] [place], [place] [address]      Total time spend on discharging this patient, including the last patient exam, discussing the hospital stay, instructions for ongoing care as it relates to all pertinent caregivers, as well as preparing the medical discharge records, prescriptions, and/or referrals as applicable, is 45 minutes.    Enzo Bi, MD  [facility] 08/20/2020, 12:27 PM

## 2020-08-20 NOTE — Evaluation (Signed)
Occupational Therapy Evaluation Patient Details Name: Rebecca Barajas MRN: [id number] DOB: [birth year] Today's Date: 08/20/2020    History of Present Illness Pt is an 86 y.o. female with medical history significant for hypertension, coronary artery disease of the LAD, DM, chrohns in remission, RA and osteoarthritis, right breast intraductal (?) cancer s/p radiation in 1999 and recurrence then undergoing complete mastectomy in January 2013, presents to the emergency department for chief concerns of right-sided numbness.  Of note patient also reports recent L wrist fracture that she is still NWB for.  Per neurology notes pt diagnosed with Bell's Palsy with MRI also showing small L frontal stroke in setting of small vessel disease.   Clinical Impression   Pt seen for OT evaluation this date. 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 Pt lives by herself, however her adult son is staying with her while he recovered from recent shoulder replacement. Pt also has 4 other adult children nearby and very supportive. Currently pt demonstrates impairments in R side facial sensation and facial droop. No visual, coordination, strength, or cognitive deficits appreciated upon assessment. Pt demonstrates baseline independence to perform ADL and mobility tasks and denies any difficulty. No skilled OT needs identified. Will sign off. Please re-consult if additional OT needs arise.    Follow Up Recommendations  No OT follow up    Equipment Recommendations  None recommended by OT    Recommendations for Other Services       Precautions / Restrictions Precautions Precautions: Fall Restrictions Weight Bearing Restrictions: No LUE Weight Bearing: Non weight bearing Other Position/Activity Restrictions: NWB through L wrist per patient report secondary to recent L wrist Fx      Mobility Bed Mobility Overal bed mobility: Independent                   Transfers Overall transfer level: Independent Equipment used: None                  Balance Overall balance assessment: Independent                                         ADL either performed or assessed with clinical judgement   ADL Overall ADL's : At baseline;Independent                                             Vision Baseline Vision/History: Wears glasses Wears Glasses: Reading only Patient Visual Report: No change from baseline Vision Assessment?: No apparent visual deficits     Perception     Praxis      Pertinent Vitals/Pain Pain Assessment: No/denies pain     Hand Dominance Right   Extremity/Trunk Assessment Upper Extremity Assessment Upper Extremity Assessment: LUE deficits/detail (RUE WFL except hand/wrist with RA pain/stiffness) LUE Deficits / Details: LUE elbow flex/ext strength WFL and equal L vs R; hand/wrist strength NT secondary to RA pain and wrist fracture LUE Sensation: WNL LUE Coordination: WNL   Lower Extremity Assessment Lower Extremity Assessment: Overall WFL for tasks assessed       Communication Communication Communication: No difficulties   Cognition Arousal/Alertness: Awake/alert Behavior During Therapy: WFL for tasks assessed/performed Overall Cognitive Status: Within Functional Limits for tasks assessed  General Comments       Exercises     Shoulder Instructions      Home Living Family/patient expects to be discharged to:: Private residence Living Arrangements: Children Available Help at Discharge: Family;Available 24 hours/day Type of Home: House Home Access: Ramped entrance;Stairs to enter Entrance Stairs-Number of Steps: 1 Entrance Stairs-Rails: None Home Layout: Two level;Able to live on main level with bedroom/bathroom     Bathroom Shower/Tub: Occupational psychologist: Standard     Home  Equipment: Shower seat - built in;Grab bars - tub/shower   Additional Comments: Pt unable to use AD's secondary to RA/pain in her hands      Prior Functioning/Environment Level of Independence: Independent        Comments: Ind amb community distances without an AD, one fall in the last year secondary to tripping in a gopher hole, Ind with ADLs, son assists with some housework        OT Problem List: Impaired sensation      OT Treatment/Interventions:      OT Goals(Current goals can be found in the care plan section) Acute Rehab OT Goals Patient Stated Goal: to go home OT Goal Formulation: All assessment and education complete, DC therapy  OT Frequency:     Barriers to D/C:            Co-evaluation              AM-PAC OT "6 Clicks" Daily Activity     Outcome Measure Help from another person eating meals?: None Help from another person taking care of personal grooming?: None Help from another person toileting, which includes using toliet, bedpan, or urinal?: None Help from another person bathing (including washing, rinsing, drying)?: None Help from another person to put on and taking off regular upper body clothing?: None Help from another person to put on and taking off regular lower body clothing?: None 6 Click Score: 24   End of Session    Activity Tolerance: Patient tolerated treatment well Patient left: in bed;with call bell/phone within reach;with family/visitor present (seated EOB for breakfast)  OT Visit Diagnosis: Other abnormalities of gait and mobility (R26.89)                Time: 7622-6333 OT Time Calculation (min): 14 min Charges:  OT General Charges $OT Visit: 1 Visit OT Evaluation $OT Eval Low Complexity: 1 Low  Jeni Salles, MPH, MS, OTR/L ascom [phone] 08/20/20, 9:22 AM

## 2020-08-20 NOTE — Progress Notes (Signed)
*  PRELIMINARY RESULTS* Echocardiogram 2D Echocardiogram has been performed.  Rebecca Barajas 08/20/2020, 10:40 AM

## 2020-08-20 NOTE — Progress Notes (Signed)
Spoke with ultrasound department. Made aware patient is pending d/c once ultrasound of carotids is completed. Will complete asap.

## 2020-08-21 LAB — ECHOCARDIOGRAM COMPLETE
AR max vel: 2.13 cm2
AV Area VTI: 2.25 cm2
AV Area mean vel: 2.08 cm2
AV Mean grad: 9 mmHg
AV Peak grad: 17.8 mmHg
Ao pk vel: 2.11 m/s
Area-P 1/2: 3.54 cm2
Height: 67 in
S' Lateral: 2.55 cm
Weight: 2560 oz

## 2022-11-07 ENCOUNTER — Emergency Department: Payer: Medicare Other

## 2022-11-07 ENCOUNTER — Encounter: Payer: Self-pay | Admitting: Oncology

## 2022-11-07 ENCOUNTER — Emergency Department
Admission: EM | Admit: 2022-11-07 | Discharge: 2022-11-07 | Disposition: A | Discharge status: Home / Self Care | Payer: Medicare Other | Attending: Emergency Medicine | Admitting: Emergency Medicine

## 2022-11-07 DIAGNOSIS — G510 Bell's palsy: Secondary | ICD-10-CM | POA: Diagnosis not present

## 2022-11-07 DIAGNOSIS — R531 Weakness: Secondary | ICD-10-CM | POA: Diagnosis present

## 2022-11-07 DIAGNOSIS — I10 Essential (primary) hypertension: Secondary | ICD-10-CM | POA: Diagnosis not present

## 2022-11-07 DIAGNOSIS — E119 Type 2 diabetes mellitus without complications: Secondary | ICD-10-CM | POA: Diagnosis not present

## 2022-11-07 DIAGNOSIS — I2510 Atherosclerotic heart disease of native coronary artery without angina pectoris: Secondary | ICD-10-CM | POA: Diagnosis not present

## 2022-11-07 DIAGNOSIS — Z853 Personal history of malignant neoplasm of breast: Secondary | ICD-10-CM | POA: Diagnosis not present

## 2022-11-07 LAB — CBC
HCT: 36.6 % (ref 36.0–46.0)
Hemoglobin: 11.8 g/dL — ABNORMAL LOW (ref 12.0–15.0)
MCH: 27.8 pg (ref 26.0–34.0)
MCHC: 32.2 g/dL (ref 30.0–36.0)
MCV: 86.1 fL (ref 80.0–100.0)
Platelets: 216 10*3/uL (ref 150–400)
RBC: 4.25 MIL/uL (ref 3.87–5.11)
RDW: 13.2 % (ref 11.5–15.5)
WBC: 6.9 10*3/uL (ref 4.0–10.5)
nRBC: 0 % (ref 0.0–0.2)

## 2022-11-07 LAB — COMPREHENSIVE METABOLIC PANEL
ALT: 20 U/L (ref 0–44)
AST: 23 U/L (ref 15–41)
Albumin: 3.7 g/dL (ref 3.5–5.0)
Alkaline Phosphatase: 70 U/L (ref 38–126)
Anion gap: 11 (ref 5–15)
BUN: 45 mg/dL — ABNORMAL HIGH (ref 8–23)
CO2: 26 mmol/L (ref 22–32)
Calcium: 9 mg/dL (ref 8.9–10.3)
Chloride: 97 mmol/L — ABNORMAL LOW (ref 98–111)
Creatinine, Ser: 1.77 mg/dL — ABNORMAL HIGH (ref 0.44–1.00)
GFR, Estimated: 28 mL/min — ABNORMAL LOW (ref >60)
Glucose, Bld: 260 mg/dL — ABNORMAL HIGH (ref 70–99)
Potassium: 4.3 mmol/L (ref 3.5–5.1)
Sodium: 134 mmol/L — ABNORMAL LOW (ref 135–145)
Total Bilirubin: 0.6 mg/dL (ref 0.3–1.2)
Total Protein: 7.2 g/dL (ref 6.5–8.1)

## 2022-11-07 LAB — PROTIME-INR
INR: 1 (ref 0.8–1.2)
Prothrombin Time: 13.2 seconds (ref 11.4–15.2)

## 2022-11-07 LAB — DIFFERENTIAL
Abs Immature Granulocytes: 0.03 10*3/uL (ref 0.00–0.07)
Basophils Absolute: 0 10*3/uL (ref 0.0–0.1)
Basophils Relative: 0 %
Eosinophils Absolute: 0.1 10*3/uL (ref 0.0–0.5)
Eosinophils Relative: 2 %
Immature Granulocytes: 0 %
Lymphocytes Relative: 32 %
Lymphs Abs: 2.2 10*3/uL (ref 0.7–4.0)
Monocytes Absolute: 0.5 10*3/uL (ref 0.1–1.0)
Monocytes Relative: 8 %
Neutro Abs: 3.9 10*3/uL (ref 1.7–7.7)
Neutrophils Relative %: 58 %

## 2022-11-07 LAB — APTT: aPTT: 30 seconds (ref 24–36)

## 2022-11-07 LAB — CBG MONITORING, ED: Glucose-Capillary: 250 mg/dL — ABNORMAL HIGH (ref 70–99)

## 2022-11-07 LAB — ETHANOL: Alcohol, Ethyl (B): <10 mg/dL (ref <10)

## 2022-11-07 MED ORDER — CARBOXYMETHYLCELLUL-GLYCERIN 0.5-0.9 % OP SOLN: 1.0000 [drp] | Freq: Four times a day (QID) | OPHTHALMIC | 0 refills | Status: AC

## 2022-11-07 MED ORDER — CARBOXYMETHYLCELLUL-GLYCERIN 0.5-0.9 % OP SOLN: 1.0000 [drp] | Freq: Four times a day (QID) | OPHTHALMIC | 0 refills | Status: DC

## 2022-11-07 NOTE — Discharge Instructions (Addendum)
Please make sure you are patching the eye at night.  Please use lubricating eyedrops.  Please discuss

## 2022-11-07 NOTE — ED Provider Notes (Signed)
Patient requested her prescription go to [facility] as now noted in chart.  They did not personally see or evaluate the patient but the facility her medical care I did send her prescription that Dr. Starleen Blue had previously written to the pharmacy of her choice   Delman Kitten, MD 11/07/22 [phone]

## 2022-11-07 NOTE — ED Triage Notes (Signed)
Pt presents to the ED via POV due to L facial droop and R facial numbness that started last night. Pt states it started with R facial numbness but this morning it started with L facial numbness that started at 7AM. Pt has a history of Bells Palsy. Pt is a poor historian. Daughter at bedside.

## 2022-11-07 NOTE — ED Provider Notes (Signed)
[facility] Provider Note    Event Date/Time   First MD Initiated Contact with Patient 11/07/22 1202     (approximate)   History   Facial Droop   HPI  Rebecca Barajas is a 89 y.o. female past medical history of coronary artery disease, GERD, hypertension, hyperlipidemia, breast cancer, right-sided Bell's palsy who presents with facial numbness and weakness.  Last night around 7 PM patient started feeling numb on the right side of her face.  When she woke up this morning she felt like her whole mouth felt numb.  Her daughter then thought her left side of the face was drooping and the right side looked more droopy than baseline.  She has a history of a right-sided Bell's palsy in 2021 but daughter notes that she had regained most of the strength on that side.  Patient denies vision symptoms denies numbness tingling weakness of the rest of her extremities denies dizziness.  Otherwise feeling well.  Does feel like her speech is little bit more difficult to get out.  Patient is otherwise feeling well denies chest pain dyspnea fevers chills urinary symptoms.    Past Medical History:  Diagnosis Date   Anemia    Breast cancer, right breast ([place])    . Initially diagnosed in 1998, stage IA, T1cN0Mx, ER positive, PR negative, HER2 unamplified of the right breast. Lumpectomy and axilla node dissection And radiation, no adjuvant treatment secondary to size of tumor. B. 07/27/11, annual mammogram: asymmetry lateral right breast. Asymmetry inferior aspect of right breast unchanged from prior studies, deep to region of scar marker. No masses, calci   CAD (coronary artery disease)    Coronary artery disease    Crohn's disease ([place])    Diabetes mellitus type 2 in nonobese ([place])    Esophageal stricture    GERD (gastroesophageal reflux disease)    Hyperlipidemia    Hypertension    Inflammatory bowel disease    with high-grade stenmanifested by Crohn's disease diagnosed in 1999    Osteoarthritis    Rheumatoid arthritis [facility])     Patient Active Problem List   Diagnosis Date Noted   CVA (cerebral vascular accident) ([place]) 08/19/2020   Essential hypertension 08/19/2020   Hyperlipidemia 08/19/2020   Brain mass 08/19/2020   Numbness and tingling of right face 08/19/2020   Weight loss 08/19/2020   History of breast cancer in female 03/06/2016   Iron deficiency anemia 04/05/2015     Physical Exam  Triage Vital Signs: ED Triage Vitals  Enc Vitals Group     BP 11/07/22 1152 (!) 180/111     Pulse Rate 11/07/22 1152 65     Resp 11/07/22 1152 18     Temp 11/07/22 1152 98.5 F (36.9 C)     Temp Source 11/07/22 1152 Oral     SpO2 11/07/22 1152 99 %     Weight 11/07/22 1150 160 lb 0.9 oz (72.6 kg)     Height 11/07/22 1150 5' 7"$  (1.702 m)     Head Circumference --      Peak Flow --      Pain Score 11/07/22 1150 0     Pain Loc --      Pain Edu? --      Excl. in [place]? --     Most recent vital signs: Vitals:   11/07/22 1500 11/07/22 1530  BP: (!) 185/77 (!) 180/81  Pulse: (!) 53 (!) 53  Resp: (!) 8 10  Temp:  SpO2: 97% 100%     General: Awake, no distress.  CV:  Good peripheral perfusion.  Resp:  Normal effort.  Abd:  No distention.  Neuro:             Awake, Alert, Oriented x 3  Other:  Aox3, nml speech  PERRL, EOMI,  nml tongue movement  +L sided facial droop that involves the eyelids but not the forehead, patient unable to keep left eye shut with forced opening Sensation symmetric all parts of face 5/5 strength in the BL upper and lower extremities  Sensation grossly intact in the BL upper and lower extremities  Finger-nose-finger intact BL    ED Results / Procedures / Treatments  Labs (all labs ordered are listed, but only abnormal results are displayed) Labs Reviewed  CBC - Abnormal; Notable for the following components:      Result Value   Hemoglobin 11.8 (*)    All other components within normal limits  COMPREHENSIVE METABOLIC PANEL -  Abnormal; Notable for the following components:   Sodium 134 (*)    Chloride 97 (*)    Glucose, Bld 260 (*)    BUN 45 (*)    Creatinine, Ser 1.77 (*)    GFR, Estimated 28 (*)    All other components within normal limits  CBG MONITORING, ED - Abnormal; Notable for the following components:   Glucose-Capillary 250 (*)    All other components within normal limits  PROTIME-INR  APTT  DIFFERENTIAL  ETHANOL  CBG MONITORING, ED     EKG  EKG reviewed interpreted myself shows sinus bradycardia left axis deviation no acute ischemic changes   RADIOLOGY I reviewed and interpreted the CT scan of the brain which does not show any acute intracranial process    PROCEDURES:  Critical Care performed: No  Procedures  The patient is on the cardiac monitor to evaluate for evidence of arrhythmia and/or significant heart rate changes.   MEDICATIONS ORDERED IN ED: Medications - No data to display   IMPRESSION / MDM / [place] / ED COURSE  I reviewed the triage vital signs and the nursing notes.                              Patient's presentation is most consistent with acute complicated illness / injury requiring diagnostic workup.  Differential diagnosis includes, but is not limited to, Bell's palsy, brainstem CVA, brain mass  The patient is an 89 year old female who presents with facial numbness and weakness.  Started last night she felt numb on the right side of the face that her entire mouth felt numb her daughter notes that the left-sided face looks to be drooping whereas it normally looks normal in the right side of face which typically has a little bit of droop due to prior Bell's palsy is more drooping.  Patient otherwise feels well denying vision change or extremity symptoms.  On exam she has asymmetric closing the eyes left is not completely closed.  She has a left-sided facial droop that does not clearly involve the forehead but does involve the eye she is not able to  resist forced opening the left eye.  Neurologic exam is otherwise normal.  She is not dysarthric or aphasic for me.  Overall I suspect that this is a left-sided Bell's palsy but somewhat confounded by the fact that patient has a prior right-sided Bell's palsy and felt numbness on the right  side of her face earlier.  Do think it is best to obtain MRI to rule out stroke.  Did discuss with patient that she will likely need steroids for presumed Bell's palsy.  Patient's daughter is quite concerned about starting her on steroids given her blood sugar has been elevated she was recently started on insulin by primary doctor and they have been working to get this under control.  Discussed that after results of MRI return we will discuss treatment with steroids versus watchful waiting.  MRI is negative for acute process.  Discussed starting prednisone for Bell's palsy with the patient and her daughter.  Patient's daughter was quite hesitant given patient's blood sugars elevated.  Ultimately they did not feel comfortable starting prednisone the ED.  I did discuss with him that prednisone has shown benefit in improving the course of Bell's palsy.  Then discussed starting antiviral if were not going to start the prednisone and patient's daughter still did not want this as patient is on multiple medications.  I discussed patching the eye and will prescribe lubricating eyedrops.  They plan to follow-up with primary doctor next week to discuss possibly starting a steroid.  I have also given neurology follow-up.     FINAL CLINICAL IMPRESSION(S) / ED DIAGNOSES   Final diagnoses:  Bell's palsy     Rx / DC Orders   ED Discharge Orders          Ordered    carboxymethylcellul-glycerin (REFRESH OPTIVE) 0.5-0.9 % ophthalmic solution  4 times daily        11/07/22 1603             Note:  This document was prepared using Dragon voice recognition software and may include unintentional dictation errors.   Rada Hay, MD 11/07/22 [phone]

## 2022-11-07 NOTE — ED Notes (Signed)
First nurse note: Pt to ED POV with family member for facial droop since last night, still present this AM upon waking. Pt states she also has slurred speech. States that droop started on R face and is now to L face.

## 2023-01-09 ENCOUNTER — Other Ambulatory Visit: Payer: Self-pay

## 2023-01-09 ENCOUNTER — Emergency Department: Payer: Medicare Other

## 2023-01-09 ENCOUNTER — Emergency Department
Admission: EM | Admit: 2023-01-09 | Discharge: 2023-01-09 | Disposition: A | Discharge status: Home / Self Care | Payer: Medicare Other | Attending: Emergency Medicine | Admitting: Emergency Medicine

## 2023-01-09 DIAGNOSIS — I10 Essential (primary) hypertension: Secondary | ICD-10-CM | POA: Diagnosis not present

## 2023-01-09 DIAGNOSIS — W19XXXA Unspecified fall, initial encounter: Secondary | ICD-10-CM

## 2023-01-09 DIAGNOSIS — W01198A Fall on same level from slipping, tripping and stumbling with subsequent striking against other object, initial encounter: Secondary | ICD-10-CM | POA: Insufficient documentation

## 2023-01-09 DIAGNOSIS — S0990XA Unspecified injury of head, initial encounter: Secondary | ICD-10-CM | POA: Diagnosis present

## 2023-01-09 DIAGNOSIS — E119 Type 2 diabetes mellitus without complications: Secondary | ICD-10-CM | POA: Diagnosis not present

## 2023-01-09 DIAGNOSIS — M79642 Pain in left hand: Secondary | ICD-10-CM | POA: Insufficient documentation

## 2023-01-09 DIAGNOSIS — S6992XA Unspecified injury of left wrist, hand and finger(s), initial encounter: Secondary | ICD-10-CM | POA: Diagnosis not present

## 2023-01-09 DIAGNOSIS — S0181XA Laceration without foreign body of other part of head, initial encounter: Secondary | ICD-10-CM | POA: Diagnosis not present

## 2023-01-09 DIAGNOSIS — M25532 Pain in left wrist: Secondary | ICD-10-CM

## 2023-01-09 NOTE — ED Triage Notes (Signed)
Pt to ED POV for fall, hitting head on brick, tripped over rug. Denies blood thinner use. Denies LOC Also c/o left hand pain, swelling noted.

## 2023-01-09 NOTE — ED Notes (Signed)
X-ray at bedside

## 2023-01-09 NOTE — ED Provider Notes (Signed)
[facility] Provider Note    Event Date/Time   First MD Initiated Contact with Patient 01/09/23 1159     (approximate)  History   Chief Complaint: Fall  HPI  Rebecca Barajas is a 89 y.o. female the past medical history of anemia, gastric reflux, diabetes, hypertension, hyperlipidemia, presents to the emergency department after a fall.  According to the patient she tripped on her front porch causing her to fall forward.  Patient is experiencing pain in the left hand/wrist as well as hitting her head on the brick step.  Patient denies LOC but did have bleeding.  Patient appears to have a small laceration to the left forehead with blood dried in her hair.  Denies any chest or abdominal pain denies any back pain.  Denies anticoagulation.  Physical Exam   Triage Vital Signs: ED Triage Vitals  Enc Vitals Group     BP 01/09/23 1155 (!) 205/93     Pulse Rate 01/09/23 1155 70     Resp 01/09/23 1155 20     Temp 01/09/23 1155 97.7 F (36.5 C)     Temp src --      SpO2 01/09/23 1155 100 %     Weight 01/09/23 1156 160 lb (72.6 kg)     Height --      Head Circumference --      Peak Flow --      Pain Score 01/09/23 1156 7     Pain Loc --      Pain Edu? --      Excl. in [place]? --     Most recent vital signs: Vitals:   01/09/23 1155  BP: (!) 205/93  Pulse: 70  Resp: 20  Temp: 97.7 F (36.5 C)  SpO2: 100%    General: Awake, no distress.  CV:  Good peripheral perfusion.  Regular rate and rhythm  Resp:  Normal effort.  Equal breath sounds bilaterally.  Abd:  No distention.  Soft, nontender.  No rebound or guarding. Other:  Dried blood in the hair small laceration approximately 1 cm to the left forehead that appears to be nongaping and hemostatic.  We will clean the rest of the blood to see if there is any other laceration or injury.  Patient does have moderate swelling of the left wrist and hand with tenderness to palpation more so of the proximal  metacarpals.   ED Results / Procedures / Treatments   RADIOLOGY  I have reviewed and interpreted the x-ray images I do not appreciate any fracture on the hand or wrist x-ray.  Radiology has read the x-rays as negative for acute fracture.  CT scan head and C-spine are negative.   MEDICATIONS ORDERED IN ED: Medications - No data to display   IMPRESSION / MDM / ASSESSMENT AND PLAN / ED COURSE  I reviewed the triage vital signs and the nursing notes.  Patient's presentation is most consistent with acute presentation with potential threat to life or bodily function.  Patient presents emergency department after mechanical fall at home did hit her head but no LOC.  Will obtain CT head and C-spine as precaution.  There is a small laceration to the left forehead that is not gaping and hemostatic.  Will clean the dried blood to see if there is any further injuries, this injury could be easily glued shut.  Patient's left wrist and hand are tender and somewhat swollen we will obtain x-ray images of the hand and wrist  to evaluate for fracture.  No proximal tenderness, good range of motion all other extremities otherwise.  Imaging is negative.  I was able to clean the dried blood off the patient.  She has a very small 1 cm laceration that is hemostatic and nongaping.  Covered with Dermabond to prevent any rebleeding.  We will place a removable wrist brace on the patient.  She will follow-up with her doctor.  Offered to update the tetanus shot but the patient states she does not wish for her tetanus to be updated and will follow-up with her doctor.  FINAL CLINICAL IMPRESSION(S) / ED DIAGNOSES   Fall Head injury Left wrist injury document was prepared using Dragon voice recognition software and may include unintentional dictation errors.   Minna Antis, MD 01/09/23 [phone]

## 2023-01-09 NOTE — ED Notes (Signed)
Ice pack given for left wrist/hand

## 2023-01-09 NOTE — ED Notes (Signed)
Ace wrap applied to left wrist. Velcro wrist brace sent home with patient and family with instructions of its use. Family verbalizes understanding.

## 2023-01-09 NOTE — Discharge Instructions (Signed)
Please wear your Velcro brace as needed for comfort.  Please follow-up with your primary care doctor for recheck/reevaluation.

## 2023-10-13 ENCOUNTER — Ambulatory Visit: Payer: Medicare Other | Admitting: Dermatology

## 2023-10-28 ENCOUNTER — Ambulatory Visit: Payer: Medicare Other | Admitting: Dermatology
